# Patient Record
Sex: Female | Born: 1970 | Race: White | Hispanic: No | Marital: Married | Smoking: Never smoker
Health system: Southern US, Community
[De-identification: ages and names within clinical notes are randomized; demographics above are authoritative.]

## PROBLEM LIST (undated history)

## (undated) DIAGNOSIS — E282 Polycystic ovarian syndrome: Secondary | ICD-10-CM

## (undated) DIAGNOSIS — E063 Autoimmune thyroiditis: Secondary | ICD-10-CM

## (undated) HISTORY — DX: Autoimmune thyroiditis: E06.3

## (undated) HISTORY — DX: Polycystic ovarian syndrome: E28.2

---

## 2004-10-13 ENCOUNTER — Ambulatory Visit: Payer: Self-pay | Admitting: Obstetrics and Gynecology

## 2004-11-25 ENCOUNTER — Ambulatory Visit: Payer: Self-pay | Admitting: Obstetrics and Gynecology

## 2004-12-10 ENCOUNTER — Ambulatory Visit: Payer: Self-pay | Admitting: Obstetrics and Gynecology

## 2005-01-24 ENCOUNTER — Ambulatory Visit: Payer: Self-pay | Admitting: Obstetrics and Gynecology

## 2005-03-30 ENCOUNTER — Ambulatory Visit: Payer: Self-pay | Admitting: Obstetrics and Gynecology

## 2005-05-31 ENCOUNTER — Ambulatory Visit: Payer: Self-pay | Admitting: Obstetrics and Gynecology

## 2005-06-08 ENCOUNTER — Ambulatory Visit: Payer: Self-pay | Admitting: Obstetrics and Gynecology

## 2005-07-22 ENCOUNTER — Ambulatory Visit: Payer: Self-pay | Admitting: Obstetrics and Gynecology

## 2005-07-27 ENCOUNTER — Ambulatory Visit: Payer: Self-pay | Admitting: Obstetrics and Gynecology

## 2005-08-24 ENCOUNTER — Ambulatory Visit: Payer: Self-pay | Admitting: Obstetrics and Gynecology

## 2005-08-25 ENCOUNTER — Observation Stay: Payer: Self-pay | Admitting: Obstetrics and Gynecology

## 2005-08-26 ENCOUNTER — Inpatient Hospital Stay: Payer: Self-pay | Admitting: Obstetrics and Gynecology

## 2006-01-24 ENCOUNTER — Ambulatory Visit: Payer: Self-pay | Admitting: Otolaryngology

## 2007-07-13 ENCOUNTER — Ambulatory Visit: Payer: Self-pay | Admitting: Otolaryngology

## 2008-10-15 ENCOUNTER — Ambulatory Visit: Payer: Self-pay | Admitting: Obstetrics and Gynecology

## 2008-10-21 ENCOUNTER — Ambulatory Visit: Payer: Self-pay | Admitting: Obstetrics and Gynecology

## 2008-11-13 ENCOUNTER — Ambulatory Visit: Payer: Self-pay | Admitting: Obstetrics and Gynecology

## 2008-11-21 ENCOUNTER — Ambulatory Visit: Payer: Self-pay | Admitting: Obstetrics and Gynecology

## 2008-12-05 ENCOUNTER — Emergency Department: Payer: Self-pay | Admitting: Emergency Medicine

## 2009-01-09 ENCOUNTER — Other Ambulatory Visit: Payer: Self-pay | Admitting: Obstetrics and Gynecology

## 2009-02-13 ENCOUNTER — Other Ambulatory Visit: Payer: Self-pay | Admitting: Obstetrics and Gynecology

## 2009-02-20 ENCOUNTER — Ambulatory Visit: Payer: Self-pay | Admitting: Obstetrics and Gynecology

## 2009-05-25 ENCOUNTER — Other Ambulatory Visit: Payer: Self-pay | Admitting: Obstetrics and Gynecology

## 2009-07-01 ENCOUNTER — Other Ambulatory Visit: Payer: Self-pay | Admitting: Obstetrics and Gynecology

## 2009-07-02 ENCOUNTER — Encounter: Payer: Self-pay | Admitting: Maternal & Fetal Medicine

## 2009-07-17 ENCOUNTER — Ambulatory Visit: Payer: Self-pay | Admitting: Obstetrics and Gynecology

## 2009-07-18 ENCOUNTER — Inpatient Hospital Stay: Payer: Self-pay | Admitting: Obstetrics and Gynecology

## 2011-05-31 ENCOUNTER — Other Ambulatory Visit: Payer: Self-pay | Admitting: Physician Assistant

## 2011-08-01 ENCOUNTER — Ambulatory Visit: Payer: Self-pay | Admitting: Internal Medicine

## 2011-11-04 ENCOUNTER — Ambulatory Visit: Payer: Self-pay | Admitting: Unknown Physician Specialty

## 2013-09-30 ENCOUNTER — Ambulatory Visit: Payer: Self-pay

## 2014-11-25 ENCOUNTER — Other Ambulatory Visit: Payer: Self-pay | Admitting: Obstetrics and Gynecology

## 2014-11-25 DIAGNOSIS — Z1231 Encounter for screening mammogram for malignant neoplasm of breast: Secondary | ICD-10-CM

## 2014-12-09 ENCOUNTER — Ambulatory Visit
Admission: RE | Admit: 2014-12-09 | Discharge: 2014-12-09 | Disposition: A | Discharge status: Home / Self Care | Payer: 59 | Source: Ambulatory Visit | Attending: Obstetrics and Gynecology | Admitting: Obstetrics and Gynecology

## 2014-12-09 DIAGNOSIS — Z1231 Encounter for screening mammogram for malignant neoplasm of breast: Secondary | ICD-10-CM | POA: Insufficient documentation

## 2015-01-16 ENCOUNTER — Other Ambulatory Visit: Payer: Self-pay | Admitting: Obstetrics and Gynecology

## 2015-03-24 ENCOUNTER — Encounter: Payer: Self-pay | Admitting: Obstetrics and Gynecology

## 2015-03-24 ENCOUNTER — Ambulatory Visit (INDEPENDENT_AMBULATORY_CARE_PROVIDER_SITE_OTHER): Payer: 59 | Admitting: Obstetrics and Gynecology

## 2015-03-24 VITALS — BP 114/72 | HR 55 | Resp 16 | Ht 64.0 in | Wt 154.0 lb

## 2015-03-24 DIAGNOSIS — N926 Irregular menstruation, unspecified: Secondary | ICD-10-CM

## 2015-03-24 DIAGNOSIS — E282 Polycystic ovarian syndrome: Secondary | ICD-10-CM

## 2015-03-24 DIAGNOSIS — Z01419 Encounter for gynecological examination (general) (routine) without abnormal findings: Secondary | ICD-10-CM

## 2015-03-25 ENCOUNTER — Other Ambulatory Visit: Payer: 59

## 2015-03-26 LAB — BASIC METABOLIC PANEL
BUN / CREAT RATIO: 16 (ref 9–23)
BUN: 12 mg/dL (ref 6–24)
CALCIUM: 9.9 mg/dL (ref 8.7–10.2)
CHLORIDE: 96 mmol/L — AB (ref 97–108)
CO2: 24 mmol/L (ref 18–29)
Creatinine, Ser: 0.75 mg/dL (ref 0.57–1.00)
GFR calc Af Amer: 114 mL/min/{1.73_m2} (ref >59)
GFR calc non Af Amer: 99 mL/min/{1.73_m2} (ref >59)
GLUCOSE: 83 mg/dL (ref 65–99)
Potassium: 4.1 mmol/L (ref 3.5–5.2)
Sodium: 138 mmol/L (ref 134–144)

## 2015-03-26 LAB — DHEA-SULFATE: DHEA-SO4: 129.4 ug/dL (ref 57.3–279.2)

## 2015-03-26 LAB — CBC
HEMATOCRIT: 43.8 % (ref 34.0–46.6)
Hemoglobin: 14.8 g/dL (ref 11.1–15.9)
MCH: 32.1 pg (ref 26.6–33.0)
MCHC: 33.8 g/dL (ref 31.5–35.7)
MCV: 95 fL (ref 79–97)
PLATELETS: 224 10*3/uL (ref 150–379)
RBC: 4.61 x10E6/uL (ref 3.77–5.28)
RDW: 13 % (ref 12.3–15.4)
WBC: 5.8 10*3/uL (ref 3.4–10.8)

## 2015-03-26 LAB — LIPID PANEL
CHOLESTEROL TOTAL: 199 mg/dL (ref 100–199)
Chol/HDL Ratio: 3.1 ratio units (ref 0.0–4.4)
HDL: 65 mg/dL (ref >39)
LDL CALC: 117 mg/dL — AB (ref 0–99)
TRIGLYCERIDES: 85 mg/dL (ref 0–149)
VLDL Cholesterol Cal: 17 mg/dL (ref 5–40)

## 2015-03-26 LAB — TESTOSTERONE, FREE, TOTAL, SHBG: TESTOSTERONE FREE: 2.4 pg/mL (ref 0.0–4.2)

## 2015-03-26 LAB — HEMOGLOBIN A1C
ESTIMATED AVERAGE GLUCOSE: 103 mg/dL
HEMOGLOBIN A1C: 5.2 % (ref 4.8–5.6)

## 2015-03-26 LAB — TSH: TSH: 2.05 u[IU]/mL (ref 0.450–4.500)

## 2015-03-27 ENCOUNTER — Encounter: Payer: Self-pay | Admitting: Obstetrics and Gynecology

## 2015-03-27 DIAGNOSIS — N926 Irregular menstruation, unspecified: Secondary | ICD-10-CM | POA: Insufficient documentation

## 2015-03-27 DIAGNOSIS — E282 Polycystic ovarian syndrome: Secondary | ICD-10-CM | POA: Insufficient documentation

## 2015-03-27 NOTE — Progress Notes (Signed)
Subjective:    Stacey Parker is a 44 y.o. P45 female who presents for an annual exam. The patient has no complaints today. The patient is sexually active. GYN screening history: last pap: approximate date 2014 and was normal and last mammogram: approximate date 11/2014 and was normal. The patient wears seatbelts: yes. The patient participates in regular exercise: yes. Has the patient ever been transfused or tattooed?: no. The patient reports that there is not domestic violence in her life.   Menstrual History: Obstetric History   G2   P2   T2   P0   A0   TAB0   SAB0   E0   M0   L2     # Outcome Date GA Lbr Len/2nd Weight Sex Delivery Anes PTL Lv  2 Term 2010   8 lb 12 oz (3.969 kg) F CS-LTranv   Y  1 Term 2007   9 lb 4 oz (4.196 kg) M CS-LTranv   Y     Menarche age: 41  Patient's last menstrual period was 03/12/2015. Denies h/o abnormal pap smears or STI's in the past.    Past Medical History  Diagnosis Date  . Polycystic ovary     Past Surgical History  Procedure Laterality Date  . Cesarean section      x2    Family History  Problem Relation Age of Onset  . Breast cancer Maternal Aunt   . Thyroid disease Mother     Social History   Social History  . Marital Status: Married    Spouse Name: N/A  . Number of Children: N/A  . Years of Education: N/A   Occupational History  . Not on file.   Social History Main Topics  . Smoking status: Never Smoker   . Smokeless tobacco: Not on file  . Alcohol Use: 0.0 oz/week    0 Standard drinks or equivalent per week  . Drug Use: No  . Sexual Activity: Not on file   Other Topics Concern  . Not on file   Social History Narrative    Current Outpatient Prescriptions on File Prior to Visit  Medication Sig Dispense Refill  . LO LOESTRIN FE 1 MG-10 MCG / 10 MCG tablet TAKE 1 TABLET BY MOUTH ONCE DAILY (Patient not taking: Reported on 03/24/2015) 28 tablet 12   No current facility-administered medications on file prior to  visit.    Allergies  Allergen Reactions  . Sulfa Antibiotics Other (See Comments)     Review of Systems Constitutional: negative for chills, fatigue, fevers and sweats Eyes: negative for irritation, redness and visual disturbance Ears, nose, mouth, throat, and face: negative for hearing loss, nasal congestion, snoring and tinnitus Respiratory: negative for asthma, cough, sputum Cardiovascular: negative for chest pain, dyspnea, exertional chest pressure/discomfort, irregular heart beat, palpitations and syncope Gastrointestinal: negative for abdominal pain, change in bowel habits, nausea and vomiting Genitourinary: negative for abnormal menstrual periods, genital lesions, sexual problems and vaginal discharge, dysuria and urinary incontinence Integument/breast: negative for breast lump, breast tenderness and nipple discharge Hematologic/lymphatic: negative for bleeding and easy bruising Musculoskeletal:negative for back pain and muscle weakness Neurological: negative for dizziness, headaches, vertigo and weakness Endocrine: negative for diabetic symptoms including polydipsia, polyuria and skin dryness Allergic/Immunologic: negative for hay fever and urticaria     Objective:    BP 114/72 mmHg  Pulse 55  Resp 16  Ht  (1.626 m)  Wt 154 lb (69.854 kg)  BMI 26.42 kg/m2  LMP 03/12/2015  General Appearance:    Alert, cooperative, no distress, appears stated age  Head:    Normocephalic, without obvious abnormality, atraumatic  Eyes:    PERRL, conjunctiva/corneas clear, EOM's intact, both eyes  Ears:    Normal external ear canals, both ears  Nose:   Nares normal, septum midline, mucosa normal, no drainage or sinus tenderness  Throat:   Lips, mucosa, and tongue normal; teeth and gums normal  Neck:   Supple, symmetrical, trachea midline, no adenopathy; thyroid: no enlargement/tenderness/nodules; no carotid bruit or JVD  Back:     Symmetric, no curvature, ROM normal, no CVA  tenderness  Lungs:     Clear to auscultation bilaterally, respirations unlabored  Chest Wall:    No tenderness or deformity   Heart:    Regular rate and rhythm, S1 and S2 normal, no murmur, rub   or gallop  Breast Exam:    No tenderness, masses, or nipple abnormality  Abdomen:     Soft, non-tender, bowel sounds active all four quadrants, no masses, no organomegaly  Genitalia:    Normal female without lesion, discharge or tenderness  Rectal:    Normal tone, internal exam not performed.     Extremities:   Extremities normal, atraumatic, no cyanosis or edema  Pulses:   2+ and symmetric all extremities  Skin:   Skin color, texture, turgor normal, no rashes or lesions  Lymph nodes:   Cervical, supraclavicular, and axillary nodes normal  Neurologic:   CNII-XII intact, normal strength, sensation and reflexes throughout  .    Assessment:    Healthy female exam.   H/o PCOS with irregular menses  Plan:     Breast self exam technique reviewed and patient encouraged to perform self-exam monthly. Contraception: currently on OCPs, but desires to discontinue for short time as she wants to know if her periods will remain regular off of the hormones (has been on OCPs for years). Patient can take several months off pills if desired.  If menses becomes irregular again, can resume pills or return to discuss alternative methods of contraception for hormonal regulation of menses.  Advised to use barrier method during time off pills for prevention of pregnancy.  Discussed healthy lifestyle modifications. Annual labs and PCOS labs ordered, will return in a.m. to complete so that she can be fasting.  Pap smear due next year.  RTC in 1 year or as needed.    Hildred Laser, MD [facility] 03/27/2015 1:10 PM

## 2015-03-31 LAB — INSULIN, FREE AND TOTAL
Free Insulin: 4.6 uU/mL
Total Insulin: 4.6 uU/mL

## 2015-04-03 LAB — VITAMIN D 1,25 DIHYDROXY
Vitamin D 1, 25 (OH)2 Total: 82 pg/mL
Vitamin D2 1, 25 (OH)2: <10 pg/mL
Vitamin D3 1, 25 (OH)2: 82 pg/mL

## 2015-05-16 ENCOUNTER — Ambulatory Visit
Admission: EM | Admit: 2015-05-16 | Discharge: 2015-05-16 | Disposition: A | Discharge status: Home / Self Care | Payer: 59 | Attending: Family Medicine | Admitting: Family Medicine

## 2015-05-16 DIAGNOSIS — J029 Acute pharyngitis, unspecified: Secondary | ICD-10-CM

## 2015-05-16 LAB — RAPID STREP SCREEN (MED CTR MEBANE ONLY): STREPTOCOCCUS, GROUP A SCREEN (DIRECT): NEGATIVE

## 2015-05-16 NOTE — ED Notes (Signed)
Patient complains of sore throat that started yesterday. Patient states that she went to play tennis today and her pain worsened. She states that she is concerned that it could be Strep.

## 2015-05-16 NOTE — ED Provider Notes (Signed)
CSN: [id number]     Arrival date & time 05/16/15  1559 History   None    Chief Complaint  Patient presents with  . Sore Throat   (Consider location/radiation/quality/duration/timing/severity/associated sxs/prior Treatment) HPI Comments: 44 yo female with a 2 days h/o sore throat. States sore throat started yesterday. Discomfort with swallowing, however denies fevers, chills, nasal congestion, cough. Has small children at home and would like to get checked for strep throat.   Patient is a 44 y.o. female presenting with pharyngitis. The history is provided by the patient.  Sore Throat    Past Medical History  Diagnosis Date  . Polycystic ovary    Past Surgical History  Procedure Laterality Date  . Cesarean section      x2   Family History  Problem Relation Age of Onset  . Breast cancer Maternal Aunt   . Thyroid disease Mother    Social History  Substance Use Topics  . Smoking status: Never Smoker   . Smokeless tobacco: None  . Alcohol Use: 0.0 oz/week    0 Standard drinks or equivalent per week   OB History    Gravida Para Term Preterm AB TAB SAB Ectopic Multiple Living   Review of Systems  Allergies  Sulfa antibiotics  Home Medications   Prior to Admission medications   Medication Sig Start Date End Date Taking? Authorizing Provider  LO LOESTRIN FE 1 MG-10 MCG / 10 MCG tablet TAKE 1 TABLET BY MOUTH ONCE DAILY Patient not taking: Reported on 03/24/2015 01/20/15   Hildred Laser, MD   Meds Ordered and Administered this Visit  Medications - No data to display  BP 95/68 mmHg  Pulse 68  Temp(Src) 98 F (36.7 C) (Tympanic)  Resp 16  Ht 5' 4.5" (1.638 m)  SpO2 98%  LMP 05/09/2015 No data found.   Physical Exam  Constitutional: She appears well-developed and well-nourished. No distress.  HENT:  Head: Normocephalic.  Right Ear: Tympanic membrane, external ear and ear canal normal.  Left Ear: Tympanic membrane, external ear and ear canal  normal.  Nose: Nose normal.  Mouth/Throat: Uvula is midline and mucous membranes are normal. Posterior oropharyngeal erythema present. No oropharyngeal exudate, posterior oropharyngeal edema or tonsillar abscesses.  Eyes: Conjunctivae and EOM are normal. Pupils are equal, round, and reactive to light. Right eye exhibits no discharge. Left eye exhibits no discharge. No scleral icterus.  Neck: Normal range of motion. Neck supple. No JVD present. No tracheal deviation present. No thyromegaly present.  Cardiovascular: Normal rate.   Pulmonary/Chest: Effort normal and breath sounds normal. No stridor. No respiratory distress. She has no wheezes. She has no rales.  Abdominal: She exhibits no distension.  Lymphadenopathy:    She has no cervical adenopathy.  Neurological: She is alert.  Skin: She is not diaphoretic.  Nursing note and vitals reviewed.   ED Course  Procedures (including critical care time)  Labs Review Labs Reviewed  RAPID STREP SCREEN (NOT AT [facility])  CULTURE, GROUP A STREP ([facility] ONLY)    Imaging Review No results found.   Visual Acuity Review  Right Eye Distance:   Left Eye Distance:   Bilateral Distance:    Right Eye Near:   Left Eye Near:    Bilateral Near:         MDM   1. Pharyngitis    (likely viral)  1. Lab results (negative rapid strep) and diagnosis reviewed  with patient 2. Recommend supportive treatment with salt water gargles, otc analgesics prn 3. Await throat culture 4. Follow up prn if symptoms worsen or don't improve    Payton Mccallumrlando Marvin Grabill, MD 05/16/15 1725

## 2015-05-18 LAB — CULTURE, GROUP A STREP (THRC)

## 2015-07-01 ENCOUNTER — Ambulatory Visit: Payer: 59 | Admitting: Obstetrics and Gynecology

## 2015-09-21 DIAGNOSIS — E349 Endocrine disorder, unspecified: Secondary | ICD-10-CM | POA: Diagnosis not present

## 2015-09-28 ENCOUNTER — Ambulatory Visit: Payer: Self-pay | Admitting: Family

## 2015-09-28 DIAGNOSIS — J029 Acute pharyngitis, unspecified: Secondary | ICD-10-CM

## 2015-09-28 DIAGNOSIS — B349 Viral infection, unspecified: Secondary | ICD-10-CM

## 2015-09-28 LAB — POCT INFLUENZA A/B: Influenza A, POC: NEGATIVE

## 2015-09-28 LAB — POCT RAPID STREP A (OFFICE): Rapid Strep A Screen: NEGATIVE

## 2015-09-29 ENCOUNTER — Encounter: Payer: Self-pay | Admitting: Family

## 2015-09-29 NOTE — Progress Notes (Signed)
See note in paper chart by Heather Ratcliffe, PAC  

## 2015-09-30 ENCOUNTER — Encounter: Payer: Self-pay | Admitting: Physician Assistant

## 2015-09-30 ENCOUNTER — Ambulatory Visit: Payer: Self-pay | Admitting: Family

## 2015-09-30 VITALS — BP 100/70 | Temp 98.4°F

## 2015-09-30 DIAGNOSIS — B9789 Other viral agents as the cause of diseases classified elsewhere: Secondary | ICD-10-CM

## 2015-09-30 DIAGNOSIS — J329 Chronic sinusitis, unspecified: Principal | ICD-10-CM

## 2015-09-30 DIAGNOSIS — E349 Endocrine disorder, unspecified: Secondary | ICD-10-CM | POA: Diagnosis not present

## 2015-10-01 NOTE — Progress Notes (Signed)
See note in paper chart by Heather Ratcliffe, PAC  

## 2016-01-25 DIAGNOSIS — E2839 Other primary ovarian failure: Secondary | ICD-10-CM | POA: Diagnosis not present

## 2016-01-25 DIAGNOSIS — E063 Autoimmune thyroiditis: Secondary | ICD-10-CM | POA: Diagnosis not present

## 2016-03-24 ENCOUNTER — Encounter: Payer: 59 | Admitting: Obstetrics and Gynecology

## 2016-05-10 ENCOUNTER — Encounter: Payer: Self-pay | Admitting: Obstetrics and Gynecology

## 2016-05-10 ENCOUNTER — Ambulatory Visit (INDEPENDENT_AMBULATORY_CARE_PROVIDER_SITE_OTHER): Payer: 59 | Admitting: Obstetrics and Gynecology

## 2016-05-10 VITALS — BP 112/62 | HR 75 | Ht 64.0 in | Wt 154.4 lb

## 2016-05-10 DIAGNOSIS — E063 Autoimmune thyroiditis: Secondary | ICD-10-CM | POA: Diagnosis not present

## 2016-05-10 DIAGNOSIS — Z01419 Encounter for gynecological examination (general) (routine) without abnormal findings: Secondary | ICD-10-CM

## 2016-05-10 DIAGNOSIS — Z124 Encounter for screening for malignant neoplasm of cervix: Secondary | ICD-10-CM | POA: Diagnosis not present

## 2016-05-10 DIAGNOSIS — Z1231 Encounter for screening mammogram for malignant neoplasm of breast: Secondary | ICD-10-CM

## 2016-05-10 NOTE — Progress Notes (Signed)
GYNECOLOGY ANNUAL PHYSICAL EXAM PROGRESS NOTE  Subjective:    Stacey Parker is a 45 y.o. [id number] female who presents for an annual exam. The patient has no complaints today. The patient is sexually active.  The patient wears seatbelts: yes. The patient participates in regular exercise: yes. Has the patient ever been transfused or tattooed?: no. The patient reports that there is not domestic violence in her life.    Gynecologic History  Menarche age: [age] Patient's last menstrual period was 04/20/2016.  Periods were irregular, q 2 weeks several months ago, now on progesterone tablets and cycles are normal.  Contraception: condoms History of STI's: Denies Last Pap: 2014. Results were: normal.  Denies h/o abnormal pap smears. Last mammogram: 11/2014. Results were: normal (BIRADS-1)   Obstetric History   G2   P2   T2   P0   A0   L2    SAB0   TAB0   Ectopic0   Multiple0   Live Births2     # Outcome Date GA Lbr Len/2nd Weight Sex Delivery Anes PTL Lv  2 Term 2010   8 lb 12 oz (3.969 kg) F CS-LTranv   LIV  1 Term 2007   9 lb 4 oz (4.196 kg) M CS-LTranv   LIV      Past Medical History:  Diagnosis Date  . Hashimoto's thyroiditis   . Polycystic ovary     Past Surgical History:  Procedure Laterality Date  . CESAREAN SECTION     x2    Family History  Problem Relation Age of Onset  . Breast cancer Maternal Aunt   . Thyroid disease Mother   . Heart attack Mother   . Prostate cancer Father   . Heart attack Maternal Grandmother   . Prostate cancer Paternal Grandfather     Social History   Social History  . Marital status: Married    Spouse name: N/A  . Number of children: N/A  . Years of education: N/A   Occupational History  . Not on file.   Social History Main Topics  . Smoking status: Never Smoker  . Smokeless tobacco: Never Used  . Alcohol use No  . Drug use: No  . Sexual activity: Yes    Birth control/ protection: Condom   Other Topics Concern  . Not  on file   Social History Narrative  . No narrative on file    Outpatient Encounter Prescriptions as of 05/10/2016  Medication Sig Note  . Progesterone Micronized (PROGESTERONE PO) Take by mouth. Take on days 12-26 through of cycle   . SYNTHROID 50 MCG tablet  05/10/2016: Received from: External Pharmacy  . [DISCONTINUED] LO LOESTRIN FE 1 MG-10 MCG / 10 MCG tablet TAKE 1 TABLET BY MOUTH ONCE DAILY (Patient not taking: Reported on 03/24/2015)    No facility-administered encounter medications on file as of 05/10/2016.      Allergies  Allergen Reactions  . Sulfa Antibiotics Other (See Comments)    Review of Systems Constitutional: negative for chills, fatigue, fevers and sweats Eyes: negative for irritation, redness and visual disturbance Ears, nose, mouth, throat, and face: negative for hearing loss, nasal congestion, snoring and tinnitus Respiratory: negative for asthma, cough, sputum Cardiovascular: negative for chest pain, dyspnea, exertional chest pressure/discomfort, irregular heart beat, palpitations and syncope Gastrointestinal: negative for abdominal pain, change in bowel habits, nausea and vomiting Genitourinary: negative for abnormal menstrual periods, genital lesions, sexual problems and vaginal discharge, dysuria and urinary incontinence Integument/breast: negative for  breast lump, breast tenderness and nipple discharge Hematologic/lymphatic: negative for bleeding and easy bruising Musculoskeletal:negative for back pain and muscle weakness Neurological: negative for dizziness, headaches, vertigo and weakness Endocrine: negative for diabetic symptoms including polydipsia, polyuria and skin dryness Allergic/Immunologic: negative for hay fever and urticaria       Objective:  Blood pressure 112/62, pulse 75, height 5\' 4"  (1.626 m), weight 154 lb 6.4 oz (70 kg), last menstrual period 04/20/2016. Body mass index is 26.5 kg/m.  General Appearance:    Alert, cooperative, no  distress, appears stated age  Head:    Normocephalic, without obvious abnormality, atraumatic  Eyes:    PERRL, conjunctiva/corneas clear, EOM's intact, both eyes  Ears:    Normal external ear canals, both ears  Nose:   Nares normal, septum midline, mucosa normal, no drainage or sinus tenderness  Throat:   Lips, mucosa, and tongue normal; teeth and gums normal  Neck:   Supple, symmetrical, trachea midline, no adenopathy; thyroid: no enlargement/tenderness/nodules; no carotid bruit or JVD  Back:     Symmetric, no curvature, ROM normal, no CVA tenderness  Lungs:     Clear to auscultation bilaterally, respirations unlabored  Chest Wall:    No tenderness or deformity   Heart:    Regular rate and rhythm, S1 and S2 normal, no murmur, rub or gallop  Breast Exam:    No tenderness, masses, or nipple abnormality  Abdomen:     Soft, non-tender, bowel sounds active all four quadrants, no masses, no organomegaly.    Genitalia:    Pelvic:external genitalia normal, vagina without lesions, discharge, or tenderness, rectovaginal septum  normal. Cervix normal in appearance, no cervical motion tenderness, no adnexal masses or tenderness.  Uterus normal size, shape, mobile, regular contours, nontender.  Rectal:    Normal external sphincter.  No hemorrhoids appreciated. Internal exam not done.   Extremities:   Extremities normal, atraumatic, no cyanosis or edema  Pulses:   2+ and symmetric all extremities  Skin:   Skin color, texture, turgor normal, no rashes or lesions  Lymph nodes:   Cervical, supraclavicular, and axillary nodes normal  Neurologic:   CNII-XII intact, normal strength, sensation and reflexes throughout    Labs:  Lab Results  Component Value Date   WBC 5.8 03/25/2015   HCT 43.8 03/25/2015   MCV 95 03/25/2015   PLT 224 03/25/2015    Lab Results  Component Value Date   CREATININE 0.75 03/25/2015   BUN 12 03/25/2015   NA 138 03/25/2015   K 4.1 03/25/2015   CL 96 (L) 03/25/2015   CO2 24  03/25/2015    No results found for: ALT, AST, GGT, ALKPHOS, BILITOT  Lab Results  Component Value Date   TSH 2.050 03/25/2015     Assessment:   Healthy female exam.  H/o Hashiomoto's thyroiditis Perimenopausal state  Plan:     Blood tests: patient had labs performed recently by PCP 3 months ago.  Will get labs (performed at [facility]). Breast self exam technique reviewed and patient encouraged to perform self-exam monthly. Discussed healthy lifestyle modifications. Mammogram ordered. Pap smear performed today.   Declines flu vaccine today.  Hashimoto's thyroiditis managed by PCP.  Perimenopausal state, currently on progesterone tablets prescribed by PCP, managing cycles which are now back to normal. Can continue.  Follow up in 1 year for annual exam     Hildred Laser, MD [facility]

## 2016-05-10 NOTE — Patient Instructions (Signed)
Health Maintenance, Female Adopting a healthy lifestyle and getting preventive care can go a long way to promote health and wellness. Talk with your health care provider about what schedule of regular examinations is right for you. This is a good chance for you to check in with your provider about disease prevention and staying healthy. In between checkups, there are plenty of things you can do on your own. Experts have done a lot of research about which lifestyle changes and preventive measures are most likely to keep you healthy. Ask your health care provider for more information. WEIGHT AND DIET  Eat a healthy diet  Be sure to include plenty of vegetables, fruits, low-fat dairy products, and lean protein.  Do not eat a lot of foods high in solid fats, added sugars, or salt.  Get regular exercise. This is one of the most important things you can do for your health.  Most adults should exercise for at least 150 minutes each week. The exercise should increase your heart rate and make you sweat (moderate-intensity exercise).  Most adults should also do strengthening exercises at least twice a week. This is in addition to the moderate-intensity exercise.  Maintain a healthy weight  Body mass index (BMI) is a measurement that can be used to identify possible weight problems. It estimates body fat based on height and weight. Your health care provider can help determine your BMI and help you achieve or maintain a healthy weight.  For females 20 years of age and older:   A BMI below 18.5 is considered underweight.  A BMI of 18.5 to 24.9 is normal.  A BMI of 25 to 29.9 is considered overweight.  A BMI of 30 and above is considered obese.  Watch levels of cholesterol and blood lipids  You should start having your blood tested for lipids and cholesterol at 43 years of age, then have this test every 5 years.  You may need to have your cholesterol levels checked more often if:  Your lipid  or cholesterol levels are high.  You are older than 43 years of age.  You are at high risk for heart disease.  CANCER SCREENING   Lung Cancer  Lung cancer screening is recommended for adults 55-80 years old who are at high risk for lung cancer because of a history of smoking.  A yearly low-dose CT scan of the lungs is recommended for people who:  Currently smoke.  Have quit within the past 15 years.  Have at least a 30-pack-year history of smoking. A pack year is smoking an average of one pack of cigarettes a day for 1 year.  Yearly screening should continue until it has been 15 years since you quit.  Yearly screening should stop if you develop a health problem that would prevent you from having lung cancer treatment.  Breast Cancer  Practice breast self-awareness. This means understanding how your breasts normally appear and feel.  It also means doing regular breast self-exams. Let your health care provider know about any changes, no matter how small.  If you are in your 20s or 30s, you should have a clinical breast exam (CBE) by a health care provider every 1-3 years as part of a regular health exam.  If you are 40 or older, have a CBE every year. Also consider having a breast X-ray (mammogram) every year.  If you have a family history of breast cancer, talk to your health care provider about genetic screening.  If you   are at high risk for breast cancer, talk to your health care provider about having an MRI and a mammogram every year.  Breast cancer gene (BRCA) assessment is recommended for women who have family members with BRCA-related cancers. BRCA-related cancers include:  Breast.  Ovarian.  Tubal.  Peritoneal cancers.  Results of the assessment will determine the need for genetic counseling and BRCA1 and BRCA2 testing. Cervical Cancer Your health care provider may recommend that you be screened regularly for cancer of the pelvic organs (ovaries, uterus, and  vagina). This screening involves a pelvic examination, including checking for microscopic changes to the surface of your cervix (Pap test). You may be encouraged to have this screening done every 3 years, beginning at age 21.  For women ages 30-65, health care providers may recommend pelvic exams and Pap testing every 3 years, or they may recommend the Pap and pelvic exam, combined with testing for human papilloma virus (HPV), every 5 years. Some types of HPV increase your risk of cervical cancer. Testing for HPV may also be done on women of any age with unclear Pap test results.  Other health care providers may not recommend any screening for nonpregnant women who are considered low risk for pelvic cancer and who do not have symptoms. Ask your health care provider if a screening pelvic exam is right for you.  If you have had past treatment for cervical cancer or a condition that could lead to cancer, you need Pap tests and screening for cancer for at least 20 years after your treatment. If Pap tests have been discontinued, your risk factors (such as having a new sexual partner) need to be reassessed to determine if screening should resume. Some women have medical problems that increase the chance of getting cervical cancer. In these cases, your health care provider may recommend more frequent screening and Pap tests. Colorectal Cancer  This type of cancer can be detected and often prevented.  Routine colorectal cancer screening usually begins at 43 years of age and continues through 43 years of age.  Your health care provider may recommend screening at an earlier age if you have risk factors for colon cancer.  Your health care provider may also recommend using home test kits to check for hidden blood in the stool.  A small camera at the end of a tube can be used to examine your colon directly (sigmoidoscopy or colonoscopy). This is done to check for the earliest forms of colorectal  cancer.  Routine screening usually begins at age 50.  Direct examination of the colon should be repeated every 5-10 years through 43 years of age. However, you may need to be screened more often if early forms of precancerous polyps or small growths are found. Skin Cancer  Check your skin from head to toe regularly.  Tell your health care provider about any new moles or changes in moles, especially if there is a change in a mole's shape or color.  Also tell your health care provider if you have a mole that is larger than the size of a pencil eraser.  Always use sunscreen. Apply sunscreen liberally and repeatedly throughout the day.  Protect yourself by wearing long sleeves, pants, a wide-brimmed hat, and sunglasses whenever you are outside. HEART DISEASE, DIABETES, AND HIGH BLOOD PRESSURE   High blood pressure causes heart disease and increases the risk of stroke. High blood pressure is more likely to develop in:  People who have blood pressure in the high end   of the normal range (130-139/85-89 mm Hg).  People who are overweight or obese.  People who are African American.  If you are 38-23 years of age, have your blood pressure checked every 3-5 years. If you are 61 years of age or older, have your blood pressure checked every year. You should have your blood pressure measured twice--once when you are at a hospital or clinic, and once when you are not at a hospital or clinic. Record the average of the two measurements. To check your blood pressure when you are not at a hospital or clinic, you can use:  An automated blood pressure machine at a pharmacy.  A home blood pressure monitor.  If you are between 45 years and 39 years old, ask your health care provider if you should take aspirin to prevent strokes.  Have regular diabetes screenings. This involves taking a blood sample to check your fasting blood sugar level.  If you are at a normal weight and have a low risk for diabetes,  have this test once every three years after 43 years of age.  If you are overweight and have a high risk for diabetes, consider being tested at a younger age or more often. PREVENTING INFECTION  Hepatitis B  If you have a higher risk for hepatitis B, you should be screened for this virus. You are considered at high risk for hepatitis B if:  You were born in a country where hepatitis B is common. Ask your health care provider which countries are considered high risk.  Your parents were born in a high-risk country, and you have not been immunized against hepatitis B (hepatitis B vaccine).  You have HIV or AIDS.  You use needles to inject street drugs.  You live with someone who has hepatitis B.  You have had sex with someone who has hepatitis B.  You get hemodialysis treatment.  You take certain medicines for conditions, including cancer, organ transplantation, and autoimmune conditions. Hepatitis C  Blood testing is recommended for:  Everyone born from [birth year] through 1963.  Anyone with known risk factors for hepatitis C. Sexually transmitted infections (STIs)  You should be screened for sexually transmitted infections (STIs) including gonorrhea and chlamydia if:  You are sexually active and are younger than 43 years of age.  You are older than 43 years of age and your health care provider tells you that you are at risk for this type of infection.  Your sexual activity has changed since you were last screened and you are at an increased risk for chlamydia or gonorrhea. Ask your health care provider if you are at risk.  If you do not have HIV, but are at risk, it may be recommended that you take a prescription medicine daily to prevent HIV infection. This is called pre-exposure prophylaxis (PrEP). You are considered at risk if:  You are sexually active and do not regularly use condoms or know the HIV status of your partner(s).  You take drugs by injection.  You are sexually  active with a partner who has HIV. Talk with your health care provider about whether you are at high risk of being infected with HIV. If you choose to begin PrEP, you should first be tested for HIV. You should then be tested every 3 months for as long as you are taking PrEP.  PREGNANCY   If you are premenopausal and you may become pregnant, ask your health care provider about preconception counseling.  If you may  become pregnant, take 400 to 800 micrograms (mcg) of folic acid every day.  If you want to prevent pregnancy, talk to your health care provider about birth control (contraception). OSTEOPOROSIS AND MENOPAUSE   Osteoporosis is a disease in which the bones lose minerals and strength with aging. This can result in serious bone fractures. Your risk for osteoporosis can be identified using a bone density scan.  If you are 61 years of age or older, or if you are at risk for osteoporosis and fractures, ask your health care provider if you should be screened.  Ask your health care provider whether you should take a calcium or vitamin D supplement to lower your risk for osteoporosis.  Menopause may have certain physical symptoms and risks.  Hormone replacement therapy may reduce some of these symptoms and risks. Talk to your health care provider about whether hormone replacement therapy is right for you.  HOME CARE INSTRUCTIONS   Schedule regular health, dental, and eye exams.  Stay current with your immunizations.   Do not use any tobacco products including cigarettes, chewing tobacco, or electronic cigarettes.  If you are pregnant, do not drink alcohol.  If you are breastfeeding, limit how much and how often you drink alcohol.  Limit alcohol intake to no more than 1 drink per day for nonpregnant women. One drink equals 12 ounces of beer, 5 ounces of wine, or 1 ounces of hard liquor.  Do not use street drugs.  Do not share needles.  Ask your health care provider for help if  you need support or information about quitting drugs.  Tell your health care provider if you often feel depressed.  Tell your health care provider if you have ever been abused or do not feel safe at home.   This information is not intended to replace advice given to you by your health care provider. Make sure you discuss any questions you have with your health care provider.   Document Released: 01/31/2011 Document Revised: 08/08/2014 Document Reviewed: 06/19/2013 Elsevier Interactive Patient Education [employer].

## 2016-05-13 LAB — PAP IG AND HPV HIGH-RISK
HPV, high-risk: NEGATIVE
PAP Smear Comment: 0

## 2016-05-16 ENCOUNTER — Telehealth: Payer: Self-pay

## 2016-05-16 NOTE — Telephone Encounter (Signed)
LM for pt informing her of normal pap and the need to have annual labs done

## 2016-05-16 NOTE — Telephone Encounter (Signed)
-----   Message from Hildred LaserAnika Cherry, MD sent at 05/16/2016 10:55 AM EDT ----- Please inform of normal pap smear.  Remind patient to have fasting annual labs done.

## 2016-06-07 ENCOUNTER — Ambulatory Visit
Admission: RE | Admit: 2016-06-07 | Discharge: 2016-06-07 | Disposition: A | Discharge status: Home / Self Care | Payer: 59 | Source: Ambulatory Visit | Attending: Obstetrics and Gynecology | Admitting: Obstetrics and Gynecology

## 2016-06-07 DIAGNOSIS — Z01419 Encounter for gynecological examination (general) (routine) without abnormal findings: Secondary | ICD-10-CM

## 2016-06-07 DIAGNOSIS — Z1231 Encounter for screening mammogram for malignant neoplasm of breast: Secondary | ICD-10-CM | POA: Insufficient documentation

## 2016-06-14 DIAGNOSIS — E2839 Other primary ovarian failure: Secondary | ICD-10-CM | POA: Diagnosis not present

## 2016-06-14 DIAGNOSIS — E063 Autoimmune thyroiditis: Secondary | ICD-10-CM | POA: Diagnosis not present

## 2016-07-01 ENCOUNTER — Encounter: Payer: Self-pay | Admitting: Physician Assistant

## 2016-07-01 ENCOUNTER — Ambulatory Visit: Payer: Self-pay | Admitting: Physician Assistant

## 2016-07-01 VITALS — BP 110/70 | HR 72 | Temp 98.3°F

## 2016-07-01 DIAGNOSIS — J0100 Acute maxillary sinusitis, unspecified: Secondary | ICD-10-CM

## 2016-07-01 MED ORDER — AMOXICILLIN 875 MG PO TABS: 875.0000 mg | ORAL_TABLET | Freq: Two times a day (BID) | ORAL | 0 refills | Status: DC

## 2016-07-01 NOTE — Progress Notes (Signed)
S: C/o runny nose and congestion for 7 days, no fever, chills, cp/sob, v/d; mucus is green and thick, cough is sporadic and dry, c/o of facial and dental pain.   Using otc meds:   O: PE: vitals wnl, nad, perrl eomi, normocephalic, tms dull, nasal mucosa red and swollen, throat injected, neck supple no lymph, lungs c t a, cv rrr, neuro intact  A:  Acute sinusitis   P: drink fluids, continue regular meds , use otc meds of choice, return if not improving in 5 days, return earlier if worsening, amoxil 875mg  bid x 10d

## 2016-10-19 DIAGNOSIS — E2839 Other primary ovarian failure: Secondary | ICD-10-CM | POA: Diagnosis not present

## 2016-10-19 DIAGNOSIS — E063 Autoimmune thyroiditis: Secondary | ICD-10-CM | POA: Diagnosis not present

## 2017-01-02 DIAGNOSIS — E063 Autoimmune thyroiditis: Secondary | ICD-10-CM | POA: Diagnosis not present

## 2017-01-02 DIAGNOSIS — E2839 Other primary ovarian failure: Secondary | ICD-10-CM | POA: Diagnosis not present

## 2017-02-23 DIAGNOSIS — E2839 Other primary ovarian failure: Secondary | ICD-10-CM | POA: Diagnosis not present

## 2017-02-23 DIAGNOSIS — E063 Autoimmune thyroiditis: Secondary | ICD-10-CM | POA: Diagnosis not present

## 2017-05-15 ENCOUNTER — Telehealth: Payer: Self-pay | Admitting: Obstetrics and Gynecology

## 2017-05-15 DIAGNOSIS — Z1239 Encounter for other screening for malignant neoplasm of breast: Secondary | ICD-10-CM

## 2017-05-15 NOTE — Telephone Encounter (Signed)
Attempted to reach pt to make her aware that physician was not currently in office till tomorrow, mailbox was full   Dr. Valentino Saxon, please advise if ok to place order for mammogram.  Pt's PE is scheduled 08/02/17

## 2017-05-15 NOTE — Telephone Encounter (Signed)
Yes, it is fine for her to have her mammogram prior to her annual exam since it was moved.

## 2017-05-15 NOTE — Telephone Encounter (Signed)
Patient called and stated that she would like to know if she is able to have her mammogram done prior to having her annual exam since the patient had to reschedule the appointment out further due to a meeting. No other information was disclosed. Please advise.

## 2017-05-16 ENCOUNTER — Encounter: Payer: 59 | Admitting: Obstetrics and Gynecology

## 2017-05-17 NOTE — Telephone Encounter (Signed)
Left message on machine to call back to inform pt that this was ok. Dr. Valentino Saxonherry has signed the order

## 2017-06-05 NOTE — Telephone Encounter (Signed)
Looks like pt is still not scheduled for her mammogram. Will follow up with pt to assure nothing further is needed

## 2017-08-02 ENCOUNTER — Encounter: Payer: Self-pay | Admitting: Obstetrics and Gynecology

## 2017-08-02 ENCOUNTER — Ambulatory Visit (INDEPENDENT_AMBULATORY_CARE_PROVIDER_SITE_OTHER): Payer: 59 | Admitting: Obstetrics and Gynecology

## 2017-08-02 VITALS — BP 106/68 | HR 67 | Ht 64.5 in | Wt 157.5 lb

## 2017-08-02 DIAGNOSIS — E063 Autoimmune thyroiditis: Secondary | ICD-10-CM

## 2017-08-02 DIAGNOSIS — E663 Overweight: Secondary | ICD-10-CM | POA: Diagnosis not present

## 2017-08-02 DIAGNOSIS — Z1231 Encounter for screening mammogram for malignant neoplasm of breast: Secondary | ICD-10-CM

## 2017-08-02 DIAGNOSIS — Z01419 Encounter for gynecological examination (general) (routine) without abnormal findings: Secondary | ICD-10-CM | POA: Diagnosis not present

## 2017-08-02 NOTE — Progress Notes (Signed)
GYNECOLOGY ANNUAL PHYSICAL EXAM PROGRESS NOTE  Subjective:    Stacey Parker is a 47 y.o. G33P2002 female who presents for an annual exam. The patient has no complaints today. The patient is sexually active.  The patient wears seatbelts: yes. The patient participates in regular exercise: yes. Has the patient ever been transfused or tattooed?: no. The patient reports that there is not domestic violence in her life.    Gynecologic History  Menarche age: 33 Patient's last menstrual period was 07/26/2017 (exact date).   Contraception: condoms History of STI's: Denies Last Pap: 05/2016. Results were: normal.  Denies h/o abnormal pap smears. Last mammogram: 06/2016. Results were: normal (BIRADS-1)   Obstetric History   G2   P2   T2   P0   A0   L2    SAB0   TAB0   Ectopic0   Multiple0   Live Births2     # Outcome Date GA Lbr Len/2nd Weight Sex Delivery Anes PTL Lv  2 Term 2010   8 lb 12 oz (3.969 kg) F CS-LTranv   LIV  1 Term 2007   9 lb 4 oz (4.196 kg) M CS-LTranv   LIV      Past Medical History:  Diagnosis Date  . Hashimoto's thyroiditis   . Polycystic ovary     Past Surgical History:  Procedure Laterality Date  . CESAREAN SECTION     x2    Family History  Problem Relation Age of Onset  . Breast cancer Maternal Aunt 58  . Thyroid disease Mother   . Heart attack Mother   . Prostate cancer Father   . Heart attack Maternal Grandmother   . Prostate cancer Paternal Grandfather     Social History   Socioeconomic History  . Marital status: Married    Spouse name: Not on file  . Number of children: Not on file  . Years of education: Not on file  . Highest education level: Not on file  Social Needs  . Financial resource strain: Not on file  . Food insecurity - worry: Not on file  . Food insecurity - inability: Not on file  . Transportation needs - medical: Not on file  . Transportation needs - non-medical: Not on file  Occupational History  . Not on file    Tobacco Use  . Smoking status: Never Smoker  . Smokeless tobacco: Never Used  Substance and Sexual Activity  . Alcohol use: No    Alcohol/week: 0.0 oz  . Drug use: No  . Sexual activity: Yes    Birth control/protection: Condom  Other Topics Concern  . Not on file  Social History Narrative  . Not on file    Outpatient Encounter Medications as of 08/02/2017  Medication Sig Note  . Progesterone Micronized (PROGESTERONE PO) Take by mouth. Take on days 12-26 through of cycle   . SYNTHROID 50 MCG tablet  05/10/2016: Received from: External Pharmacy  . [DISCONTINUED] amoxicillin (AMOXIL) 875 MG tablet Take 1 tablet (875 mg total) by mouth 2 (two) times daily.    No facility-administered encounter medications on file as of 08/02/2017.      Allergies  Allergen Reactions  . Sulfa Antibiotics Other (See Comments)    Review of Systems Constitutional: negative for chills, fatigue, fevers and sweats Eyes: negative for irritation, redness and visual disturbance Ears, nose, mouth, throat, and face: negative for hearing loss, nasal congestion, snoring and tinnitus Respiratory: negative for asthma, cough, sputum Cardiovascular: negative for  chest pain, dyspnea, exertional chest pressure/discomfort, irregular heart beat, palpitations and syncope Gastrointestinal: negative for abdominal pain, change in bowel habits, nausea and vomiting Genitourinary: negative for abnormal menstrual periods, genital lesions, sexual problems and vaginal discharge, dysuria and urinary incontinence Integument/breast: negative for breast lump, breast tenderness and nipple discharge Hematologic/lymphatic: negative for bleeding and easy bruising Musculoskeletal:negative for back pain and muscle weakness Neurological: negative for dizziness, headaches, vertigo and weakness Endocrine: negative for diabetic symptoms including polydipsia, polyuria and skin dryness Allergic/Immunologic: negative for hay fever and urticaria        Objective:  Blood pressure 106/68, pulse 67, height 5' 4.5" (1.638 m), weight 157 lb 8 oz (71.4 kg), last menstrual period 07/26/2017. Body mass index is 26.62 kg/m.  General Appearance:    Alert, cooperative, no distress, appears stated age, overweight  Head:    Normocephalic, without obvious abnormality, atraumatic  Eyes:    PERRL, conjunctiva/corneas clear, EOM's intact, both eyes  Ears:    Normal external ear canals, both ears  Nose:   Nares normal, septum midline, mucosa normal, no drainage or sinus tenderness  Throat:   Lips, mucosa, and tongue normal; teeth and gums normal  Neck:   Supple, symmetrical, trachea midline, no adenopathy; thyroid: no enlargement/tenderness/nodules; no carotid bruit or JVD  Back:     Symmetric, no curvature, ROM normal, no CVA tenderness  Lungs:     Clear to auscultation bilaterally, respirations unlabored  Chest Wall:    No tenderness or deformity   Heart:    Regular rate and rhythm, S1 and S2 normal, no murmur, rub or gallop  Breast Exam:    No tenderness, masses, or nipple abnormality  Abdomen:     Soft, non-tender, bowel sounds active all four quadrants, no masses, no organomegaly.    Genitalia:    Pelvic:external genitalia normal, vagina without lesions, discharge, or tenderness, rectovaginal septum  normal. Cervix normal in appearance, no cervical motion tenderness, no adnexal masses or tenderness.  Uterus normal size, shape, mobile, regular contours, nontender.  Rectal:    Normal external sphincter.  No hemorrhoids appreciated. Internal exam not done.   Extremities:   Extremities normal, atraumatic, no cyanosis or edema  Pulses:   2+ and symmetric all extremities  Skin:   Skin color, texture, turgor normal, no rashes or lesions  Lymph nodes:   Cervical, supraclavicular, and axillary nodes normal  Neurologic:   CNII-XII intact, normal strength, sensation and reflexes throughout    Labs:  Lab Results  Component Value Date   WBC 5.8  03/25/2015   HGB 14.8 03/25/2015   HCT 43.8 03/25/2015   MCV 95 03/25/2015   PLT 224 03/25/2015    Lab Results  Component Value Date   CREATININE 0.75 03/25/2015   BUN 12 03/25/2015   NA 138 03/25/2015   K 4.1 03/25/2015   CL 96 (L) 03/25/2015   CO2 24 03/25/2015    No results found for: ALT, AST, GGT, ALKPHOS, BILITOT  Lab Results  Component Value Date   TSH 2.050 03/25/2015    Lab Results  Component Value Date   CHOL 199 03/25/2015   HDL 65 03/25/2015   LDLCALC 117 (H) 03/25/2015   TRIG 85 03/25/2015   CHOLHDL 3.1 03/25/2015    Assessment:   Healthy female exam.   Overweight H/o Hashiomoto's thyroiditis  Plan:     Blood tests: CBC, CMP ordered today. Has thyroid levels and hormone levels checked by PCP (Functional Medicine).  Breast self exam technique reviewed  and patient encouraged to perform self-exam monthly. Discussed healthy lifestyle modifications. Mammogram ordered. Pap smear up to date.  Next due in 2020.  Up to date on flu vaccine (received at job).  Hashimoto's thyroiditis managed by PCP.  Follow up in 1 year for annual exam     Hildred Laserherry, Ryliegh Mcduffey, MD [facility]

## 2017-08-02 NOTE — Progress Notes (Signed)
Pt is here for her annual exam. Also needs a mammogram order.

## 2017-08-03 LAB — COMPREHENSIVE METABOLIC PANEL
ALBUMIN: 4.9 g/dL (ref 3.5–5.5)
ALT: 23 IU/L (ref 0–32)
AST: 25 IU/L (ref 0–40)
Albumin/Globulin Ratio: 2 (ref 1.2–2.2)
Alkaline Phosphatase: 59 IU/L (ref 39–117)
BILIRUBIN TOTAL: 0.5 mg/dL (ref 0.0–1.2)
BUN / CREAT RATIO: 18 (ref 9–23)
BUN: 17 mg/dL (ref 6–24)
CO2: 20 mmol/L (ref 20–29)
CREATININE: 0.92 mg/dL (ref 0.57–1.00)
Calcium: 9.7 mg/dL (ref 8.7–10.2)
Chloride: 103 mmol/L (ref 96–106)
GFR calc non Af Amer: 76 mL/min/{1.73_m2} (ref >59)
GFR, EST AFRICAN AMERICAN: 88 mL/min/{1.73_m2} (ref >59)
Globulin, Total: 2.5 g/dL (ref 1.5–4.5)
Glucose: 94 mg/dL (ref 65–99)
Potassium: 4.4 mmol/L (ref 3.5–5.2)
Sodium: 141 mmol/L (ref 134–144)
TOTAL PROTEIN: 7.4 g/dL (ref 6.0–8.5)

## 2017-08-03 LAB — CBC
HEMATOCRIT: 41.4 % (ref 34.0–46.6)
HEMOGLOBIN: 13.8 g/dL (ref 11.1–15.9)
MCH: 32 pg (ref 26.6–33.0)
MCHC: 33.3 g/dL (ref 31.5–35.7)
MCV: 96 fL (ref 79–97)
Platelets: 227 10*3/uL (ref 150–379)
RBC: 4.31 x10E6/uL (ref 3.77–5.28)
RDW: 12.6 % (ref 12.3–15.4)
WBC: 4 10*3/uL (ref 3.4–10.8)

## 2017-09-01 DIAGNOSIS — E2839 Other primary ovarian failure: Secondary | ICD-10-CM | POA: Diagnosis not present

## 2017-09-01 DIAGNOSIS — E063 Autoimmune thyroiditis: Secondary | ICD-10-CM | POA: Diagnosis not present

## 2018-02-08 DIAGNOSIS — E2839 Other primary ovarian failure: Secondary | ICD-10-CM | POA: Diagnosis not present

## 2018-02-08 DIAGNOSIS — E063 Autoimmune thyroiditis: Secondary | ICD-10-CM | POA: Diagnosis not present

## 2018-02-19 ENCOUNTER — Ambulatory Visit: Payer: 59 | Admitting: Sports Medicine

## 2018-07-06 DIAGNOSIS — E2839 Other primary ovarian failure: Secondary | ICD-10-CM | POA: Diagnosis not present

## 2018-07-06 DIAGNOSIS — E063 Autoimmune thyroiditis: Secondary | ICD-10-CM | POA: Diagnosis not present

## 2018-07-18 ENCOUNTER — Other Ambulatory Visit: Payer: Self-pay | Admitting: Obstetrics and Gynecology

## 2018-07-18 DIAGNOSIS — Z1231 Encounter for screening mammogram for malignant neoplasm of breast: Secondary | ICD-10-CM

## 2018-08-07 ENCOUNTER — Encounter: Payer: 59 | Admitting: Obstetrics and Gynecology

## 2018-08-23 ENCOUNTER — Inpatient Hospital Stay: Admission: RE | Admit: 2018-08-23 | Payer: Self-pay | Source: Ambulatory Visit

## 2019-01-29 DIAGNOSIS — Q528 Other specified congenital malformations of female genitalia: Secondary | ICD-10-CM | POA: Diagnosis not present

## 2019-01-29 DIAGNOSIS — R5383 Other fatigue: Secondary | ICD-10-CM | POA: Diagnosis not present

## 2019-01-29 DIAGNOSIS — N941 Unspecified dyspareunia: Secondary | ICD-10-CM | POA: Diagnosis not present

## 2019-01-29 DIAGNOSIS — Z01419 Encounter for gynecological examination (general) (routine) without abnormal findings: Secondary | ICD-10-CM | POA: Diagnosis not present

## 2019-01-29 DIAGNOSIS — Z0000 Encounter for general adult medical examination without abnormal findings: Secondary | ICD-10-CM | POA: Diagnosis not present

## 2019-01-29 DIAGNOSIS — Z1239 Encounter for other screening for malignant neoplasm of breast: Secondary | ICD-10-CM | POA: Diagnosis not present

## 2019-03-04 DIAGNOSIS — E063 Autoimmune thyroiditis: Secondary | ICD-10-CM | POA: Diagnosis not present

## 2019-03-04 DIAGNOSIS — E559 Vitamin D deficiency, unspecified: Secondary | ICD-10-CM | POA: Diagnosis not present

## 2019-04-05 ENCOUNTER — Other Ambulatory Visit: Payer: Self-pay

## 2019-04-05 ENCOUNTER — Ambulatory Visit: Payer: 59 | Admitting: Family Medicine

## 2019-04-05 ENCOUNTER — Encounter: Payer: Self-pay | Admitting: Family Medicine

## 2019-04-05 ENCOUNTER — Ambulatory Visit: Payer: Self-pay

## 2019-04-05 VITALS — BP 106/78 | HR 61 | Ht 64.5 in | Wt 151.0 lb

## 2019-04-05 DIAGNOSIS — M7711 Lateral epicondylitis, right elbow: Secondary | ICD-10-CM

## 2019-04-05 DIAGNOSIS — M25521 Pain in right elbow: Secondary | ICD-10-CM

## 2019-04-05 DIAGNOSIS — M7061 Trochanteric bursitis, right hip: Secondary | ICD-10-CM | POA: Diagnosis not present

## 2019-04-05 MED ORDER — PENNSAID 2 % TD SOLN: 2.0000 g | Freq: Two times a day (BID) | TRANSDERMAL | 3 refills | Status: DC

## 2019-04-05 MED ORDER — PENNSAID 2 % TD SOLN: 2.0000 g | Freq: Two times a day (BID) | TRANSDERMAL | 3 refills | Status: AC

## 2019-04-05 NOTE — Progress Notes (Signed)
[facility] [address], [place] [address] Phone: [phone] Subjective:   I Stacey Parker am serving as a Education administrator for Dr. Hulan Saas.   CC: Right elbow pain, right hip pain  IEP:[id number]  Stacey Parker is a 48 y.o. female coming in with complaint of right elbow and right hip pain. Plays tennis. Played 4 days a week and noticed her elbow hurting. Has not been playing tennis. States her pain has been so bad that she couldn't up a glass. Still has some loss of strength. Hip is doing better. Brought some brooks. States she has pain when playing tennis. Elbow pain is worse. States she controls her hip pain with a lot of stretching and better shoes. Lateral hip. Believes her right foot rolls in her shoe. Only has pain with playing tennis as well as the elbow. Still doing pushups and planks. Forearm pain mostly.   Onset- chronic  Location - medial and lateral Duration-  Character- Aggravating factors-increasing activity Reliving factors-decreasing activity has helped Therapies tried- theragun, thera stick, ice, stretching, brace Severity- hip pain 1/2 elbow pain elbow pain Elbow 2     Past Medical History:  Diagnosis Date  . Hashimoto's thyroiditis   . Polycystic ovary    Past Surgical History:  Procedure Laterality Date  . CESAREAN SECTION     x2   Social History   Socioeconomic History  . Marital status: Married    Spouse name: Not on file  . Number of children: Not on file  . Years of education: Not on file  . Highest education level: Not on file  Occupational History  . Not on file  Social Needs  . Financial resource strain: Not on file  . Food insecurity    Worry: Not on file    Inability: Not on file  . Transportation needs    Medical: Not on file    Non-medical: Not on file  Tobacco Use  . Smoking status: Never Smoker  . Smokeless tobacco: Never Used  Substance and Sexual Activity  . Alcohol use: No   Alcohol/week: 0.0 standard drinks  . Drug use: No  . Sexual activity: Yes    Birth control/protection: Condom  Lifestyle  . Physical activity    Days per week: Not on file    Minutes per session: Not on file  . Stress: Not on file  Relationships  . Social Herbalist on phone: Not on file    Gets together: Not on file    Attends religious service: Not on file    Active member of club or organization: Not on file    Attends meetings of clubs or organizations: Not on file    Relationship status: Not on file  Other Topics Concern  . Not on file  Social History Narrative  . Not on file   Allergies  Allergen Reactions  . Sulfa Antibiotics Other (See Comments)   Family History  Problem Relation Age of Onset  . Breast cancer Maternal Aunt 58  . Thyroid disease Mother   . Heart attack Mother   . Prostate cancer Father   . Heart attack Maternal Grandmother   . Prostate cancer Paternal Grandfather     Current Outpatient Medications (Endocrine & Metabolic):  .  Progesterone Micronized (PROGESTERONE PO), Take by mouth. Take on days 12-26 through of cycle .  SYNTHROID 50 MCG tablet,          Past medical  history, social, surgical and family history all reviewed in electronic medical record.  No pertanent information unless stated regarding to the chief complaint.   Review of Systems:  No headache, visual changes, nausea, vomiting, diarrhea, constipation, dizziness, abdominal pain, skin rash, fevers, chills, night sweats, weight loss, swollen lymph nodes, body aches, joint swelling, muscle aches, chest pain, shortness of breath, mood changes.   Objective  There were no vitals taken for this visit. Systems examined below as of    General: No apparent distress alert and oriented x3 mood and affect normal, dressed appropriately.  HEENT: Pupils equal, extraocular movements intact  Respiratory: Patient's speak in full sentences and does not appear short of breath   Cardiovascular: No lower extremity edema, non tender, no erythema  Skin: Warm dry intact with no signs of infection or rash on extremities or on axial skeleton.  Abdomen: Soft nontender  Neuro: Cranial nerves II through XII are intact, neurovascularly intact in all extremities with 2+ DTRs and 2+ pulses.  Lymph: No lymphadenopathy of posterior or anterior cervical chain or axillae bilaterally.  Gait normal with good balance and coordination.  MSK:  tender with limited range of motion and good stability and symmetric strength and tone of shoulders,  wrist,  knee and ankles bilaterally.  Elbow: Right Unremarkable to inspection. Mild limitation in full extension Stable to varus, valgus stress. Negative moving valgus stress test. Tender over the lateral epicondylar region Ulnar nerve does not sublux. Negative cubital tunnel Tinel's. Contralateral elbow unremarkable  Right hip exam has full range of motion but is tender to palpation over the greater trochanteric area with mild tightness with Pearlean BrownieFaber test but does have full range of motion.  Negative straight leg test.  5-5 strength of the lower extremity.  Limited musculoskeletal ultrasound was performed and interpreted by Judi SaaZachary M Smith  Limited ultrasound shows the patient lateral epicondylitis does have some mild hypoechoic changes but no true tear appreciated.  Minimal increase in neovascularization.  No scarring noted.  No effusion of the joint.  Radial head has some what appears to be a remote fracture but appears completely healed.    Impression and Recommendations:     This case required medical decision making of moderate complexity. The above documentation has been reviewed and is accurate and complete Judi SaaZachary M Smith, DO       Note: This dictation was prepared with Dragon dictation along with smaller phrase technology. Any transcriptional errors that result from this process are unintentional.

## 2019-04-05 NOTE — Patient Instructions (Signed)
Wrist brace Exercises 3 times a week.  pennsaid pinkie amount topically 2 times daily as needed.  Tart cherry extract 1200mg  at night Follow up with me in 6 weeks

## 2019-04-05 NOTE — Assessment & Plan Note (Signed)
HEP, icing, topical NSAIDs RTC in 4-6 weeks

## 2019-04-05 NOTE — Assessment & Plan Note (Signed)
Lateral epicondylitis, given home exercises, wrist brace, avoid extension of the wrist, discussed icing regimen, topical anti-inflammatories given, follow-up again in 4 to 6 weeks.  Worsening pain consider nitroglycerin, formal physical therapy as well.

## 2019-05-16 ENCOUNTER — Other Ambulatory Visit: Payer: Self-pay

## 2019-05-16 ENCOUNTER — Ambulatory Visit: Payer: 59 | Admitting: Family Medicine

## 2019-05-16 ENCOUNTER — Encounter: Payer: Self-pay | Admitting: Family Medicine

## 2019-05-16 DIAGNOSIS — M7711 Lateral epicondylitis, right elbow: Secondary | ICD-10-CM

## 2019-05-16 DIAGNOSIS — M999 Biomechanical lesion, unspecified: Secondary | ICD-10-CM

## 2019-05-16 DIAGNOSIS — M7061 Trochanteric bursitis, right hip: Secondary | ICD-10-CM | POA: Diagnosis not present

## 2019-05-16 MED ORDER — DICLOFENAC SODIUM 2 % TD SOLN: 2.0000 g | Freq: Two times a day (BID) | TRANSDERMAL | 3 refills | Status: AC

## 2019-05-16 NOTE — Patient Instructions (Addendum)
[facility] pharmacy [phone] Keep working on the hip Avoid contact with elbow and hard surfaces Continue exercises See me 5-6 weeks

## 2019-05-16 NOTE — Assessment & Plan Note (Signed)
Stable.  Patient declined injection, attempted osteopathic manipulation with some improvement.  Discussed which activities to do which wants to avoid.  Follow-up again in 4 to 8 weeks

## 2019-05-16 NOTE — Assessment & Plan Note (Signed)
Improvement noted, increase activity slowly.  Discussed icing regimen and home exercises, patient will do a different wrap on her tennis racquet.  Follow-up again in 4 to 8 weeks

## 2019-05-16 NOTE — Progress Notes (Signed)
[facility] [address] [place], [place] [address] Phone: [phone] Subjective:   Stacey Parker, am serving as a scribe for Dr. Hulan Saas.  I'm seeing this patient by the request  of:    CC: Right elbow pain follow-up  [id number]   04/05/2019 Lateral epicondylitis, given home exercises, wrist brace, avoid extension of the wrist, discussed icing regimen, topical anti-inflammatories given, follow-up again in 4 to 6 weeks.  Worsening pain consider nitroglycerin, formal physical therapy as well.  Update 05/16/2019 Stacey Parker is a 48 y.o. female coming in with complaint of right elbow pain. Lateral pain has subsided but is now having medial pain which is where her pain started. Patient feels that office set up at home is increasing her pain. Tennis also increased her pain. Was wearing the brace for one month.   Patient also has felt improvement with hip if she stretches. Still does have intermittent sharp pain over the greater trochanter.     Past Medical History:  Diagnosis Date  . Hashimoto's thyroiditis   . Polycystic ovary    Past Surgical History:  Procedure Laterality Date  . CESAREAN SECTION     x2   Social History   Socioeconomic History  . Marital status: Married    Spouse name: Not on file  . Number of children: Not on file  . Years of education: Not on file  . Highest education level: Not on file  Occupational History  . Not on file  Social Needs  . Financial resource strain: Not on file  . Food insecurity    Worry: Not on file    Inability: Not on file  . Transportation needs    Medical: Not on file    Non-medical: Not on file  Tobacco Use  . Smoking status: Never Smoker  . Smokeless tobacco: Never Used  Substance and Sexual Activity  . Alcohol use: Parker    Alcohol/week: 0.0 standard drinks  . Drug use: Parker  . Sexual activity: Yes    Birth control/protection: Condom  Lifestyle  . Physical activity   Days per week: Not on file    Minutes per session: Not on file  . Stress: Not on file  Relationships  . Social Herbalist on phone: Not on file    Gets together: Not on file    Attends religious service: Not on file    Active member of club or organization: Not on file    Attends meetings of clubs or organizations: Not on file    Relationship status: Not on file  Other Topics Concern  . Not on file  Social History Narrative  . Not on file   Allergies  Allergen Reactions  . Sulfa Antibiotics Other (See Comments)   Family History  Problem Relation Age of Onset  . Breast cancer Maternal Aunt 58  . Thyroid disease Mother   . Heart attack Mother   . Prostate cancer Father   . Heart attack Maternal Grandmother   . Prostate cancer Paternal Grandfather     Current Outpatient Medications (Endocrine & Metabolic):  .  Progesterone Micronized (PROGESTERONE PO), Take by mouth. Take on days 12-26 through of cycle .  SYNTHROID 50 MCG tablet,       Current Outpatient Medications (Other):  [name]  Diclofenac Sodium (PENNSAID) 2 % SOLN, Place 2 g onto the skin 2 (two) times daily. .  Diclofenac Sodium 2 % SOLN, Place 2 g  onto the skin 2 (two) times daily.    Past medical history, social, surgical and family history all reviewed in electronic medical record.  Parker pertanent information unless stated regarding to the chief complaint.   Review of Systems:  Parker headache, visual changes, nausea, vomiting, diarrhea, constipation, dizziness, abdominal pain, skin rash, fevers, chills, night sweats, weight loss, swollen lymph nodes, body aches, joint swelling, muscle aches, chest pain, shortness of breath, mood changes.   Objective  Height 5' 4.5" (1.638 m), weight 146 lb (66.2 kg).    General: Parker apparent distress alert and oriented x3 mood and affect normal, dressed appropriately.  HEENT: Pupils equal, extraocular movements intact  Respiratory: Patient's speak in full sentences and  does not appear short of breath  Cardiovascular: Parker lower extremity edema, non tender, Parker erythema  Skin: Warm dry intact with Parker signs of infection or rash on extremities or on axial skeleton.  Abdomen: Soft nontender  Neuro: Cranial nerves II through XII are intact, neurovascularly intact in all extremities with 2+ DTRs and 2+ pulses.  Lymph: Parker lymphadenopathy of posterior or anterior cervical chain or axillae bilaterally.  Gait normal with good balance and coordination.  MSK:  Non tender with full range of motion and good stability and symmetric strength and tone of shoulders, , wrist, hip, knee and ankles bilaterally.  Right ankle still mild pain overall.  Nothing severe.  Mild pain over the lateral epicondylar region but improvement in range of motion.  Right hip exam shows some tenderness over the greater trochanteric area.  Positive Pearlean Brownie.  Negative straight leg test.  Tightness around the right sacroiliac joint.  Osteopathic findings C5 flexed rotated and side bent left T4 extended rotated and side bent right inhaled rib T9 extended rotated and side bent left L1 flexed rotated and side bent right Sacrum right on right    Impression and Recommendations:     This case required medical decision making of moderate complexity. The above documentation has been reviewed and is accurate and complete Judi Saa, DO       Note: This dictation was prepared with Dragon dictation along with smaller phrase technology. Any transcriptional errors that result from this process are unintentional.

## 2019-05-16 NOTE — Assessment & Plan Note (Signed)
Decision today to treat with OMT was based on Physical Exam  After verbal consent patient was treated with HVLA, ME, FPR techniques in  thoracic, lumbar and sacral areas  Patient tolerated the procedure well with improvement in symptoms  Patient given exercises, stretches and lifestyle modifications  See medications in patient instructions if given  Patient will follow up in 4-8 weeks 

## 2019-06-17 ENCOUNTER — Ambulatory Visit: Payer: 59 | Admitting: Family Medicine

## 2019-07-04 DIAGNOSIS — H524 Presbyopia: Secondary | ICD-10-CM | POA: Diagnosis not present

## 2019-07-05 DIAGNOSIS — J340 Abscess, furuncle and carbuncle of nose: Secondary | ICD-10-CM | POA: Diagnosis not present

## 2019-07-11 ENCOUNTER — Ambulatory Visit: Payer: 59 | Admitting: Family Medicine

## 2019-07-11 ENCOUNTER — Other Ambulatory Visit: Payer: Self-pay

## 2019-07-11 ENCOUNTER — Encounter: Payer: Self-pay | Admitting: Family Medicine

## 2019-07-11 VITALS — BP 118/82 | HR 70 | Ht 64.5 in | Wt 146.0 lb

## 2019-07-11 DIAGNOSIS — M7061 Trochanteric bursitis, right hip: Secondary | ICD-10-CM | POA: Diagnosis not present

## 2019-07-11 DIAGNOSIS — M999 Biomechanical lesion, unspecified: Secondary | ICD-10-CM

## 2019-07-11 MED ORDER — MELOXICAM 7.5 MG PO TABS: 7.5000 mg | ORAL_TABLET | Freq: Every day | ORAL | 0 refills | Status: AC

## 2019-07-11 NOTE — Assessment & Plan Note (Signed)
Decision today to treat with OMT was based on Physical Exam  After verbal consent patient was treated with HVLA, ME, FPR techniques in , thoracic, lumbar and sacral areas  Patient tolerated the procedure well with improvement in symptoms  Patient given exercises, stretches and lifestyle modifications  See medications in patient instructions if given  Patient will follow up in 4-8 weeks 

## 2019-07-11 NOTE — Patient Instructions (Addendum)
  [address], [address] [place], [place] [address] Phone [phone]  Meloxicam daily for 10 days Consider PRP See me again in 4-6 weeks

## 2019-07-11 NOTE — Assessment & Plan Note (Signed)
Continue gluteal tendinitis.  Discussed which activities near which wants to avoid.  Discussed which activities to do which wants to avoid patient continues wants to try conservative therapy.

## 2019-07-11 NOTE — Progress Notes (Signed)
[facility] [place], [place] [address] Phone: [phone] Subjective:   Stacey Parker, am serving as a scribe for Dr. Hulan Saas.  This visit occurred during the SARS-CoV-2 public health emergency.  Safety protocols were in place, including screening questions prior to the visit, additional usage of staff PPE, and extensive cleaning of exam room while observing appropriate contact time as indicated for disinfecting solutions.    CC: Right hip and back pain  [id number]   06/16/2019 Stable.  Patient declined injection, attempted osteopathic manipulation with some improvement.  Discussed which activities to do which wants to avoid.  Follow-up again in 4 to 8 weeks  Improvement noted, increase activity slowly.  Discussed icing regimen and home exercises, patient will do a different wrap on her tennis racquet.  Follow-up again in 4 to 8 weeks  Update 07/11/2019 Stacey Parker is a 48 y.o. female coming in with complaint of right elbow and right hip pain. Patient states that her elbow pain is better than last visit.   Continues to have right hip pain. Pennsaid does help with pain. Pain is constant due to sitting more for work during [employer].      Past Medical History:  Diagnosis Date  . Hashimoto's thyroiditis   . Polycystic ovary    Past Surgical History:  Procedure Laterality Date  . CESAREAN SECTION     x2   Social History   Socioeconomic History  . Marital status: Married    Spouse name: Not on file  . Number of children: Not on file  . Years of education: Not on file  . Highest education level: Not on file  Occupational History  . Not on file  Tobacco Use  . Smoking status: Never Smoker  . Smokeless tobacco: Never Used  Substance and Sexual Activity  . Alcohol use: Parker    Alcohol/week: 0.0 standard drinks  . Drug use: Parker  . Sexual activity: Yes    Birth control/protection: Condom  Other Topics Concern  . Not  on file  Social History Narrative  . Not on file   Social Determinants of Health   Financial Resource Strain:   . Difficulty of Paying Living Expenses: Not on file  Food Insecurity:   . Worried About Charity fundraiser in the Last Year: Not on file  . Ran Out of Food in the Last Year: Not on file  Transportation Needs:   . Lack of Transportation (Medical): Not on file  . Lack of Transportation (Non-Medical): Not on file  Physical Activity:   . Days of Exercise per Week: Not on file  . Minutes of Exercise per Session: Not on file  Stress:   . Feeling of Stress : Not on file  Social Connections:   . Frequency of Communication with Friends and Family: Not on file  . Frequency of Social Gatherings with Friends and Family: Not on file  . Attends Religious Services: Not on file  . Active Member of Clubs or Organizations: Not on file  . Attends Archivist Meetings: Not on file  . Marital Status: Not on file   Allergies  Allergen Reactions  . Sulfa Antibiotics Other (See Comments)   Family History  Problem Relation Age of Onset  . Breast cancer Maternal Aunt 58  . Thyroid disease Mother   . Heart attack Mother   . Prostate cancer Father   . Heart attack Maternal Grandmother   . Prostate  cancer Paternal Grandfather     Current Outpatient Medications (Endocrine & Metabolic):  .  Progesterone Micronized (PROGESTERONE PO), Take by mouth. Take on days 12-26 through of cycle .  SYNTHROID 50 MCG tablet,     Current Outpatient Medications (Analgesics):  .  meloxicam (MOBIC) 7.5 MG tablet, Take 1 tablet (7.5 mg total) by mouth daily.   Current Outpatient Medications (Other):  [name]  Diclofenac Sodium (PENNSAID) 2 % SOLN, Place 2 g onto the skin 2 (two) times daily. .  Diclofenac Sodium 2 % SOLN, Place 2 g onto the skin 2 (two) times daily.    Past medical history, social, surgical and family history all reviewed in electronic medical record.  Parker pertanent information  unless stated regarding to the chief complaint.   Review of Systems:  Parker headache, visual changes, nausea, vomiting, diarrhea, constipation, dizziness, abdominal pain, skin rash, fevers, chills, night sweats, weight loss, swollen lymph nodes, body aches, joint swelling, muscle aches, chest pain, shortness of breath, mood changes.   Objective  Blood pressure 118/82, pulse 70, height 5' 4.5" (1.638 m), weight 146 lb (66.2 kg), SpO2 98 %.    General: Parker apparent distress alert and oriented x3 mood and affect normal, dressed appropriately.  HEENT: Pupils equal, extraocular movements intact  Respiratory: Patient's speak in full sentences and does not appear short of breath  Cardiovascular: Parker lower extremity edema, non tender, Parker erythema  Skin: Warm dry intact with Parker signs of infection or rash on extremities or on axial skeleton.  Abdomen: Soft nontender  Neuro: Cranial nerves II through XII are intact, neurovascularly intact in all extremities with 2+ DTRs and 2+ pulses.  Lymph: Parker lymphadenopathy of posterior or anterior cervical chain or axillae bilaterally.  Gait normal with good balance and coordination.  MSK:  Non tender with full range of motion and good stability and symmetric strength and tone of shoulders, elbows, wrist, hip, knee and ankles bilaterally.  Back exam does have some mild loss of lordosis.  Patient does have a positive Pearlean Brownie on the right side.  Pain over the piriformis as well as the gluteal tendon on the right side.  Negative straight leg test.  Minimal discomfort in the paraspinal musculature lumbar spine right greater than left  Osteopathic findings T6 extended rotated and side bent left L2 flexed rotated and side bent right Sacrum right on right    Impression and Recommendations:     This case required medical decision making of moderate complexity. The above documentation has been reviewed and is accurate and complete Judi Saa, DO       Note: This  dictation was prepared with Dragon dictation along with smaller phrase technology. Any transcriptional errors that result from this process are unintentional.

## 2019-08-15 ENCOUNTER — Ambulatory Visit: Payer: 59 | Admitting: Family Medicine

## 2019-08-15 ENCOUNTER — Other Ambulatory Visit: Payer: Self-pay

## 2019-08-15 ENCOUNTER — Encounter: Payer: Self-pay | Admitting: Family Medicine

## 2019-08-15 VITALS — BP 100/70 | HR 74 | Ht 64.0 in | Wt 146.0 lb

## 2019-08-15 DIAGNOSIS — M999 Biomechanical lesion, unspecified: Secondary | ICD-10-CM | POA: Diagnosis not present

## 2019-08-15 DIAGNOSIS — M7061 Trochanteric bursitis, right hip: Secondary | ICD-10-CM | POA: Diagnosis not present

## 2019-08-15 NOTE — Assessment & Plan Note (Signed)
Patient is doing a little bit better at this time.  Discussed the possibility of injection but I do not think will be necessary.  Discussed icing regimen and home exercises, discussed which activities to do which wants to avoid.  Patient will increase activity slowly over the course the next several weeks.  Follow-up again in 4 to 8 weeks

## 2019-08-15 NOTE — Assessment & Plan Note (Signed)
Decision today to treat with OMT was based on Physical Exam  After verbal consent patient was treated with HVLA, ME, FPR techniques in  thoracic, lumbar and sacral areas  Patient tolerated the procedure well with improvement in symptoms  Patient given exercises, stretches and lifestyle modifications  See medications in patient instructions if given  Patient will follow up in 4-6 weeks 

## 2019-08-15 NOTE — Progress Notes (Signed)
[facility] [address] [place] Phone: [phone] Subjective:   I Stacey Parker am serving as a Education administrator for Dr. Hulan Saas.  This visit occurred during the SARS-CoV-2 public health emergency.  Safety protocols were in place, including screening questions prior to the visit, additional usage of staff PPE, and extensive cleaning of exam room while observing appropriate contact time as indicated for disinfecting solutions.   I'm seeing this patient by the request  of:    CC: Hip pain, back pain follow-up  [id number]   07/11/2019 Continue gluteal tendinitis.  Discussed which activities near which wants to avoid.  Discussed which activities to do which wants to avoid patient continues wants to try conservative therapy.  Update 08/15/2019 Stacey Parker is a 49 y.o. female coming in with complaint of back and hip pain. Last seen on 07/11/2019 for OMT. Patient states she is feeling pretty good. Some nerve pain in the elbow. Hip was better but now the pain is intermitted. Has discontinued yoga.  Patient states overall doing relatively well.  Does feel that stretching still seems to irritate more of the side of the hip.  Did respond extremely well to osteopathic manipulation previously.      Past Medical History:  Diagnosis Date  . Hashimoto's thyroiditis   . Polycystic ovary    Past Surgical History:  Procedure Laterality Date  . CESAREAN SECTION     x2   Social History   Socioeconomic History  . Marital status: Married    Spouse name: Not on file  . Number of children: Not on file  . Years of education: Not on file  . Highest education level: Not on file  Occupational History  . Not on file  Tobacco Use  . Smoking status: Never Smoker  . Smokeless tobacco: Never Used  Substance and Sexual Activity  . Alcohol use: No    Alcohol/week: 0.0 standard drinks  . Drug use: No  . Sexual activity: Yes    Birth  control/protection: Condom  Other Topics Concern  . Not on file  Social History Narrative  . Not on file   Social Determinants of Health   Financial Resource Strain:   . Difficulty of Paying Living Expenses: Not on file  Food Insecurity:   . Worried About Charity fundraiser in the Last Year: Not on file  . Ran Out of Food in the Last Year: Not on file  Transportation Needs:   . Lack of Transportation (Medical): Not on file  . Lack of Transportation (Non-Medical): Not on file  Physical Activity:   . Days of Exercise per Week: Not on file  . Minutes of Exercise per Session: Not on file  Stress:   . Feeling of Stress : Not on file  Social Connections:   . Frequency of Communication with Friends and Family: Not on file  . Frequency of Social Gatherings with Friends and Family: Not on file  . Attends Religious Services: Not on file  . Active Member of Clubs or Organizations: Not on file  . Attends Archivist Meetings: Not on file  . Marital Status: Not on file   Allergies  Allergen Reactions  . Sulfa Antibiotics Other (See Comments)   Family History  Problem Relation Age of Onset  . Breast cancer Maternal Aunt 58  . Thyroid disease Mother   . Heart attack Mother   . Prostate cancer Father   . Heart attack Maternal  Grandmother   . Prostate cancer Paternal Grandfather     Current Outpatient Medications (Endocrine & Metabolic):  .  Progesterone Micronized (PROGESTERONE PO), Take by mouth. Take on days 12-26 through of cycle .  SYNTHROID 50 MCG tablet,     Current Outpatient Medications (Analgesics):  .  meloxicam (MOBIC) 7.5 MG tablet, Take 1 tablet (7.5 mg total) by mouth daily.   Current Outpatient Medications (Other):  [name]  Diclofenac Sodium (PENNSAID) 2 % SOLN, Place 2 g onto the skin 2 (two) times daily. .  Diclofenac Sodium 2 % SOLN, Place 2 g onto the skin 2 (two) times daily.    Past medical history, social, surgical and family history all reviewed  in electronic medical record.  No pertanent information unless stated regarding to the chief complaint.   Review of Systems:  No headache, visual changes, nausea, vomiting, diarrhea, constipation, dizziness, abdominal pain, skin rash, fevers, chills, night sweats, weight loss, swollen lymph nodes, body aches, joint swelling, muscle aches, chest pain, shortness of breath, mood changes.   Objective  Blood pressure 100/70, pulse 74, height 5\' 4"  (1.626 m), weight 146 lb (66.2 kg), SpO2 97 %.    General: No apparent distress alert and oriented x3 mood and affect normal, dressed appropriately.  HEENT: Pupils equal, extraocular movements intact  Respiratory: Patient's speak in full sentences and does not appear short of breath  Cardiovascular: No lower extremity edema, non tender, no erythema  Skin: Warm dry intact with no signs of infection or rash on extremities or on axial skeleton.  Abdomen: Soft nontender  Neuro: Cranial nerves II through XII are intact, neurovascularly intact in all extremities with 2+ DTRs and 2+ pulses.  Lymph: No lymphadenopathy of posterior or anterior cervical chain or axillae bilaterally.  Gait normal with good balance and coordination.  MSK:  Non tender with full range of motion and good stability and symmetric strength and tone of shoulders, elbows, wrist, hip, knee and ankles bilaterally.  Back exam shows the patient does have some mild tenderness to palpation around the right sacroiliac joint.  Mild tightness with right mild pain in the piriformis.  Osteopathic findings  T6 extended rotated and side bent left L4 flexed rotated and side bent left  Sacrum right on right    Impression and Recommendations:     This case required medical decision making of moderate complexity. The above documentation has been reviewed and is accurate and complete Pearlean Brownie, DO       Note: This dictation was prepared with Dragon dictation along with smaller phrase  technology. Any transcriptional errors that result from this process are unintentional.

## 2019-08-15 NOTE — Patient Instructions (Signed)
Good to see you Continue to improve Keep working on hip strengthening See me again in 5-6 weeks

## 2019-09-02 DIAGNOSIS — E063 Autoimmune thyroiditis: Secondary | ICD-10-CM | POA: Diagnosis not present

## 2019-09-02 DIAGNOSIS — K589 Irritable bowel syndrome without diarrhea: Secondary | ICD-10-CM | POA: Diagnosis not present

## 2019-09-20 ENCOUNTER — Ambulatory Visit: Payer: 59 | Admitting: Family Medicine

## 2019-10-03 ENCOUNTER — Other Ambulatory Visit: Payer: Self-pay | Admitting: Obstetrics & Gynecology

## 2019-10-03 DIAGNOSIS — Z1231 Encounter for screening mammogram for malignant neoplasm of breast: Secondary | ICD-10-CM

## 2019-10-08 ENCOUNTER — Ambulatory Visit (INDEPENDENT_AMBULATORY_CARE_PROVIDER_SITE_OTHER): Payer: 59 | Admitting: Family Medicine

## 2019-10-08 ENCOUNTER — Other Ambulatory Visit: Payer: Self-pay

## 2019-10-08 ENCOUNTER — Encounter: Payer: Self-pay | Admitting: Family Medicine

## 2019-10-08 VITALS — BP 104/72 | HR 78 | Ht 64.0 in | Wt 150.0 lb

## 2019-10-08 DIAGNOSIS — M7061 Trochanteric bursitis, right hip: Secondary | ICD-10-CM | POA: Diagnosis not present

## 2019-10-08 DIAGNOSIS — M999 Biomechanical lesion, unspecified: Secondary | ICD-10-CM | POA: Diagnosis not present

## 2019-10-08 NOTE — Assessment & Plan Note (Signed)
Decision today to treat with OMT was based on Physical Exam  After verbal consent patient was treated with HVLA, ME, FPR techniques in  thoracic, lumbar and sacral areas  Patient tolerated the procedure well with improvement in symptoms  Patient given exercises, stretches and lifestyle modifications  See medications in patient instructions if given  Patient will follow up in 4-8 weeks 

## 2019-10-08 NOTE — Patient Instructions (Signed)
Ok to do yoga and tennis 3x a week See me in 7-8 weeks

## 2019-10-08 NOTE — Progress Notes (Signed)
[name] [address] [place] [address] Phone: [phone] Subjective:   Stacey Parker, am serving as a scribe for Dr. Antoine Primas. This visit occurred during the SARS-CoV-2 public health emergency.  Safety protocols were in place, including screening questions prior to the visit, additional usage of staff PPE, and extensive cleaning of exam room while observing appropriate contact time as indicated for disinfecting solutions.   I'm seeing this patient by the request  of:  Patient, No Pcp Per  CC: Hip and back pain follow-up  [id number]   08/15/2019 Patient is doing a little bit better at this time.  Discussed the possibility of injection but I do not think will be necessary.  Discussed icing regimen and home exercises, discussed which activities to do which wants to avoid. Patient will increase activity slowly over the course the next several weeks.  Follow-up again in 4 to 8 weeks  Update 10/08/2019 Stacey Parker is a 49 y.o. female coming in with complaint of back pain and right hip pain. Last seen on 08/15/2019 for OMT. Patient states that she is doing better. Has not been back to yoga or tennis. Feels she is ready to return though.       Past Medical History:  Diagnosis Date  . Hashimoto's thyroiditis   . Polycystic ovary    Past Surgical History:  Procedure Laterality Date  . CESAREAN SECTION     x2   Social History   Socioeconomic History  . Marital status: Married    Spouse name: Not on file  . Number of children: Not on file  . Years of education: Not on file  . Highest education level: Not on file  Occupational History  . Not on file  Tobacco Use  . Smoking status: Never Smoker  . Smokeless tobacco: Never Used  Substance and Sexual Activity  . Alcohol use: No    Alcohol/week: 0.0 standard drinks  . Drug use: No  . Sexual activity: Yes    Birth control/protection: Condom  Other Topics Concern  . Not on  file  Social History Narrative  . Not on file   Social Determinants of Health   Financial Resource Strain:   . Difficulty of Paying Living Expenses: Not on file  Food Insecurity:   . Worried About Programme researcher, broadcasting/film/video in the Last Year: Not on file  . Ran Out of Food in the Last Year: Not on file  Transportation Needs:   . Lack of Transportation (Medical): Not on file  . Lack of Transportation (Non-Medical): Not on file  Physical Activity:   . Days of Exercise per Week: Not on file  . Minutes of Exercise per Session: Not on file  Stress:   . Feeling of Stress : Not on file  Social Connections:   . Frequency of Communication with Friends and Family: Not on file  . Frequency of Social Gatherings with Friends and Family: Not on file  . Attends Religious Services: Not on file  . Active Member of Clubs or Organizations: Not on file  . Attends Banker Meetings: Not on file  . Marital Status: Not on file   Allergies  Allergen Reactions  . Sulfa Antibiotics Other (See Comments)   Family History  Problem Relation Age of Onset  . Breast cancer Maternal Aunt 58  . Thyroid disease Mother   . Heart attack Mother   . Prostate cancer Father   . Heart attack  Maternal Grandmother   . Prostate cancer Paternal Grandfather     Current Outpatient Medications (Endocrine & Metabolic):  .  Progesterone Micronized (PROGESTERONE PO), Take by mouth. Take on days 12-26 through of cycle .  SYNTHROID 50 MCG tablet,     Current Outpatient Medications (Analgesics):  .  meloxicam (MOBIC) 7.5 MG tablet, Take 1 tablet (7.5 mg total) by mouth daily.   Current Outpatient Medications (Other):  [name]  Diclofenac Sodium (PENNSAID) 2 % SOLN, Place 2 g onto the skin 2 (two) times daily. .  Diclofenac Sodium 2 % SOLN, Place 2 g onto the skin 2 (two) times daily.   Reviewed prior external information including notes and imaging from  primary care provider As well as notes that were available  from care everywhere and other healthcare systems.  Past medical history, social, surgical and family history all reviewed in electronic medical record.  No pertanent information unless stated regarding to the chief complaint.   Review of Systems:  No headache, visual changes, nausea, vomiting, diarrhea, constipation, dizziness, abdominal pain, skin rash, fevers, chills, night sweats, weight loss, swollen lymph nodes, body aches, joint swelling, chest pain, shortness of breath, mood changes. POSITIVE muscle aches  Objective  Blood pressure 104/72, pulse 78, height 5\' 4"  (1.626 m), weight 150 lb (68 kg), SpO2 98 %.   General: No apparent distress alert and oriented x3 mood and affect normal, dressed appropriately.  HEENT: Pupils equal, extraocular movements intact  Respiratory: Patient's speak in full sentences and does not appear short of breath  Cardiovascular: No lower extremity edema, non tender, no erythema  Skin: Warm dry intact with no signs of infection or rash on extremities or on axial skeleton.  Abdomen: Soft nontender  Neuro: Cranial nerves II through XII are intact, neurovascularly intact in all extremities with 2+ DTRs and 2+ pulses.  Lymph: No lymphadenopathy of posterior or anterior cervical chain or axillae bilaterally.  Gait normal with good balance and coordination.  MSK:  Non tender with full range of motion and good stability and symmetric strength and tone of shoulders, elbows, wrist, knee and ankles bilaterally.   Back exam does have some mild loss of lordosis.  Tender to palpation of paraspinal musculature.  Negative straight leg test.  Near full range of motion.  Still some discomfort over the lateral aspect of the hip.  5 out of 5 strength of the lower extremities are noted.  Osteopathic findings  T5 extended rotated and side bent left L2 flexed rotated and side bent right Sacrum right on right    Impression and Recommendations:     This case required medical  decision making of moderate complexity. The above documentation has been reviewed and is accurate and complete Lyndal Pulley, DO       Note: This dictation was prepared with Dragon dictation along with smaller phrase technology. Any transcriptional errors that result from this process are unintentional.

## 2019-10-08 NOTE — Assessment & Plan Note (Signed)
Overall improvement.  We discussed icing regimen and home exercises.  We discussed the core strengthening hip abductor strengthening that I think is more beneficial.  Patient will follow up with me again in 4-8

## 2019-10-28 ENCOUNTER — Ambulatory Visit
Admission: RE | Admit: 2019-10-28 | Discharge: 2019-10-28 | Disposition: A | Discharge status: Home / Self Care | Payer: 59 | Source: Ambulatory Visit | Attending: Obstetrics & Gynecology | Admitting: Obstetrics & Gynecology

## 2019-10-28 DIAGNOSIS — Z1231 Encounter for screening mammogram for malignant neoplasm of breast: Secondary | ICD-10-CM | POA: Diagnosis not present

## 2019-11-01 DIAGNOSIS — E063 Autoimmune thyroiditis: Secondary | ICD-10-CM | POA: Diagnosis not present

## 2019-11-26 ENCOUNTER — Other Ambulatory Visit: Payer: Self-pay

## 2019-11-26 ENCOUNTER — Encounter: Payer: Self-pay | Admitting: Family Medicine

## 2019-11-26 ENCOUNTER — Ambulatory Visit: Payer: 59 | Admitting: Family Medicine

## 2019-11-26 VITALS — BP 92/60 | HR 58 | Ht 64.0 in | Wt 151.0 lb

## 2019-11-26 DIAGNOSIS — M999 Biomechanical lesion, unspecified: Secondary | ICD-10-CM | POA: Diagnosis not present

## 2019-11-26 DIAGNOSIS — M545 Low back pain, unspecified: Secondary | ICD-10-CM

## 2019-11-26 DIAGNOSIS — G8929 Other chronic pain: Secondary | ICD-10-CM | POA: Diagnosis not present

## 2019-11-26 NOTE — Assessment & Plan Note (Signed)

## 2019-11-26 NOTE — Patient Instructions (Addendum)
Good to see you [place], addias tennis or ultra See me again in 6-8 weeks

## 2019-11-26 NOTE — Progress Notes (Signed)
[facility] [address] [place] Phone: [phone] Subjective:   I Stacey Parker am serving as a Education administrator for Dr. Hulan Saas.  This visit occurred during the SARS-CoV-2 public health emergency.  Safety protocols were in place, including screening questions prior to the visit, additional usage of staff PPE, and extensive cleaning of exam room while observing appropriate contact time as indicated for disinfecting solutions.   I'm seeing this patient by the request  of:  Patient, No Pcp Per  CC: Back pain follow-up  [id number]  Stacey Parker is a 49 y.o. female coming in with complaint of back pain. Last seen 10/08/2019 for OMT. Patient states she was doing good for a while until last week. Lower back and hip pain. Achy pain in the hip. Patient is exercising and walking. Still hasn't played tennis due to ADL.      Past Medical History:  Diagnosis Date  . Hashimoto's thyroiditis   . Polycystic ovary    Past Surgical History:  Procedure Laterality Date  . CESAREAN SECTION     x2   Social History   Socioeconomic History  . Marital status: Married    Spouse name: Not on file  . Number of children: Not on file  . Years of education: Not on file  . Highest education level: Not on file  Occupational History  . Not on file  Tobacco Use  . Smoking status: Never Smoker  . Smokeless tobacco: Never Used  Substance and Sexual Activity  . Alcohol use: No    Alcohol/week: 0.0 standard drinks  . Drug use: No  . Sexual activity: Yes    Birth control/protection: Condom  Other Topics Concern  . Not on file  Social History Narrative  . Not on file   Social Determinants of Health   Financial Resource Strain:   . Difficulty of Paying Living Expenses:   Food Insecurity:   . Worried About Charity fundraiser in the Last Year:   . Arboriculturist in the Last Year:   Transportation Needs:   . Film/video editor (Medical):     [name] Lack of Transportation (Non-Medical):   Physical Activity:   . Days of Exercise per Week:   . Minutes of Exercise per Session:   Stress:   . Feeling of Stress :   Social Connections:   . Frequency of Communication with Friends and Family:   . Frequency of Social Gatherings with Friends and Family:   . Attends Religious Services:   . Active Member of Clubs or Organizations:   . Attends Archivist Meetings:   [name] Marital Status:    Allergies  Allergen Reactions  . Sulfa Antibiotics Other (See Comments)   Family History  Problem Relation Age of Onset  . Breast cancer Maternal Aunt 58  . Thyroid disease Mother   . Heart attack Mother   . Prostate cancer Father   . Heart attack Maternal Grandmother   . Prostate cancer Paternal Grandfather     Current Outpatient Medications (Endocrine & Metabolic):  .  Progesterone Micronized (PROGESTERONE PO), Take by mouth. Take on days 12-26 through of cycle .  SYNTHROID 50 MCG tablet,     Current Outpatient Medications (Analgesics):  .  meloxicam (MOBIC) 7.5 MG tablet, Take 1 tablet (7.5 mg total) by mouth daily.   Current Outpatient Medications (Other):  [name]  Diclofenac Sodium (PENNSAID) 2 % SOLN, Place 2 g onto the  skin 2 (two) times daily. .  Diclofenac Sodium 2 % SOLN, Place 2 g onto the skin 2 (two) times daily.   Reviewed prior external information including notes and imaging from  primary care provider As well as notes that were available from care everywhere and other healthcare systems.  Past medical history, social, surgical and family history all reviewed in electronic medical record.  No pertanent information unless stated regarding to the chief complaint.   Review of Systems:  No headache, visual changes, nausea, vomiting, diarrhea, constipation, dizziness, abdominal pain, skin rash, fevers, chills, night sweats, weight loss, swollen lymph nodes, body aches, joint swelling, chest pain, shortness of breath, mood  changes. POSITIVE muscle aches  Objective  Blood pressure 92/60, pulse (!) 58, height 5\' 4"  (1.626 m), weight 151 lb (68.5 kg), last menstrual period 10/28/2019, SpO2 97 %.   General: No apparent distress alert and oriented x3 mood and affect normal, dressed appropriately.  HEENT: Pupils equal, extraocular movements intact  Respiratory: Patient's speak in full sentences and does not appear short of breath  Cardiovascular: No lower extremity edema, non tender, no erythema  Neuro: Cranial nerves II through XII are intact, neurovascularly intact in all extremities with 2+ DTRs and 2+ pulses.  Gait normal with good balance and coordination.  MSK:  Non tender with full range of motion and good stability and symmetric strength and tone of shoulders, elbows, wrist, hip, knee and ankles bilaterally.  Back exam some mild tightness noted in the thoracolumbar juncture and minorly over the paraspinal musculature lumbar spine.  Thoracolumbar seems to be worse.  Osteopathic findings C2 flexed rotated and side bent right C6 flexed rotated and side bent left T3 extended rotated and side bent right inhaled third rib T6 extended rotated and side bent left L2 flexed rotated and side bent right Sacrum right on right    Impression and Recommendations:     This case required medical decision making of moderate complexity. The above documentation has been reviewed and is accurate and complete 10/30/2019, DO       Note: This dictation was prepared with Dragon dictation along with smaller phrase technology. Any transcriptional errors that result from this process are unintentional.

## 2019-11-26 NOTE — Assessment & Plan Note (Signed)
Chronic problem but does seem to be stable.  Discussed again about the medication management including meloxicam.  Patient is responding well to manipulation.  Topical anti-inflammatories if needed.  Discussed icing regimen.  Follow-up again in 4 to 8 weeks

## 2019-12-31 ENCOUNTER — Telehealth: Payer: Self-pay | Admitting: Family Medicine

## 2019-12-31 NOTE — Telephone Encounter (Signed)
Pt is just back to playing tennis and noticed the same pain as before. She completed wearing the brace and does her exercises/ice, but can tell her elbow will be problematic with tennis.  Do we recommend a wrist brace or tennis elbow brace that she could wear while playing tennis?

## 2019-12-31 NOTE — Telephone Encounter (Signed)
Sent My-Chart message

## 2020-01-01 NOTE — Telephone Encounter (Signed)
Pt given MD response from MyChart.

## 2020-01-14 ENCOUNTER — Other Ambulatory Visit: Payer: Self-pay

## 2020-01-14 ENCOUNTER — Ambulatory Visit: Payer: 59 | Admitting: Family Medicine

## 2020-01-14 ENCOUNTER — Encounter: Payer: Self-pay | Admitting: Family Medicine

## 2020-01-14 DIAGNOSIS — M7711 Lateral epicondylitis, right elbow: Secondary | ICD-10-CM | POA: Diagnosis not present

## 2020-01-14 DIAGNOSIS — M999 Biomechanical lesion, unspecified: Secondary | ICD-10-CM

## 2020-01-14 MED ORDER — NITROGLYCERIN 0.2 MG/HR TD PT24: MEDICATED_PATCH | TRANSDERMAL | 0 refills | Status: DC

## 2020-01-14 NOTE — Assessment & Plan Note (Signed)
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  Topical anti-inflammatories and meloxicam.  I am hoping patient does relatively well overall.  We discussed bracing with certain areas as well as ergonomics at work and might be more beneficial.  Continued problems I would like to doing ultrasound with a questionable injection or discussed PRP.

## 2020-01-14 NOTE — Patient Instructions (Signed)
Nitroglycerin Protocol   Apply 1/4 nitroglycerin patch to affected area daily.  Change position of patch within the affected area every 24 hours.  You may experience a headache during the first 1-2 weeks of using the patch, these should subside.  If you experience headaches after beginning nitroglycerin patch treatment, you may take your preferred over the counter pain reliever.  Another side effect of the nitroglycerin patch is skin irritation or rash related to patch adhesive.  Please notify our office if you develop more severe headaches or rash, and stop the patch.  Tendon healing with nitroglycerin patch may require 12 to 24 weeks depending on the extent of injury.  Men should not use if taking Viagra, Cialis, or Levitra.   Do not use if you have migraines or rosacea.  Veritcal mouse Wear wrist brace at night See me 7-8 weeks

## 2020-01-14 NOTE — Progress Notes (Signed)
[name] [address] [place] [address] Phone: [phone] Subjective:   [name], am serving as a scribe for Dr. Antoine Primas.  This visit occurred during the SARS-CoV-2 public health emergency.  Safety protocols were in place, including screening questions prior to the visit, additional usage of staff PPE, and extensive cleaning of exam room while observing appropriate contact time as indicated for disinfecting solutions.   I'm seeing this patient by the request  of:  Patient, No Pcp Per  CC:   [id number]  Stacey Parker is a 49 y.o. female coming in with complaint of back and neck pain. OMT 11/26/2019. Patient states that her hip has improved. Is using Newton shoes and feels these have helped her. Also is being more aware of crossing her legs and trying to not cross her legs.   Pain over medial aspect of right elbow. Did take a lesson last week to work on form. Pain in elbow typically occurs after playing. Has been doing HEP. Just changed raquet from larger to smaller circumference.   Medications patient has been prescribed: Meloxicam more as needed as well as topical anti-inflammatories          Reviewed prior external information including notes and imaging from previsou exam, outside providers and external EMR if available.   As well as notes that were available from care everywhere and other healthcare systems.  Past medical history, social, surgical and family history all reviewed in electronic medical record.  No pertanent information unless stated regarding to the chief complaint.   Past Medical History:  Diagnosis Date  . Hashimoto's thyroiditis   . Polycystic ovary     Allergies  Allergen Reactions  . Sulfa Antibiotics Other (See Comments)     Review of Systems:  No headache, visual changes, nausea, vomiting, diarrhea, constipation, dizziness, abdominal pain, skin rash, fevers, chills, night sweats,  weight loss, swollen lymph nodes, body aches, joint swelling, chest pain, shortness of breath, mood changes. POSITIVE muscle aches  Objective  Blood pressure 120/72, pulse 74, height 5\' 4"  (1.626 m), weight 151 lb (68.5 kg), SpO2 97 %.   General: No apparent distress alert and oriented x3 mood and affect normal, dressed appropriately.  HEENT: Pupils equal, extraocular movements intact  Respiratory: Patient's speak in full sentences and does not appear short of breath  Cardiovascular: No lower extremity edema, non tender, no erythema  Neuro: Cranial nerves II through XII are intact, neurovascularly intact in all extremities with 2+ DTRs and 2+ pulses.  Gait normal with good balance and coordination.  MSK: Right elbow exam does show the patient does have some discomfort with the lateral epicondylar region as well as the medial epicondylar region but no instability of the wrist noted for the elbow.  Mild increase in discomfort though with resisted extension and flexion of the wrist at the elbow   Back -mild tightness in the thoracolumbar junction right greater than left.  Negative straight leg test.  Minimal pain over the greater trochanteric area.  Osteopathic findings   T8 extended rotated and side bent left L1 flexed rotated and side bent right Sacrum right on right       Assessment and Plan:    Nonallopathic problems  Decision today to treat with OMT was based on Physical Exam  After verbal consent patient was treated with HVLA, ME, FPR techniques in cervical, rib, thoracic, lumbar, and sacral  areas  Patient tolerated the procedure well with improvement  in symptoms  Patient given exercises, stretches and lifestyle modifications  See medications in patient instructions if given  Patient will follow up in 4-8 weeks      The above documentation has been reviewed and is accurate and complete Lyndal Pulley, DO       Note: This dictation was prepared with Dragon  dictation along with smaller phrase technology. Any transcriptional errors that result from this process are unintentional.

## 2020-01-16 ENCOUNTER — Encounter: Payer: Self-pay | Admitting: Family Medicine

## 2020-01-20 ENCOUNTER — Other Ambulatory Visit: Payer: Self-pay

## 2020-01-20 DIAGNOSIS — M7711 Lateral epicondylitis, right elbow: Secondary | ICD-10-CM

## 2020-01-23 ENCOUNTER — Ambulatory Visit: Payer: 59 | Attending: Family Medicine | Admitting: Physical Therapy

## 2020-01-23 ENCOUNTER — Encounter: Payer: Self-pay | Admitting: Physical Therapy

## 2020-01-23 ENCOUNTER — Other Ambulatory Visit: Payer: Self-pay

## 2020-01-23 DIAGNOSIS — M25521 Pain in right elbow: Secondary | ICD-10-CM | POA: Diagnosis not present

## 2020-01-23 DIAGNOSIS — M79621 Pain in right upper arm: Secondary | ICD-10-CM | POA: Diagnosis not present

## 2020-01-23 DIAGNOSIS — M542 Cervicalgia: Secondary | ICD-10-CM | POA: Diagnosis not present

## 2020-01-23 DIAGNOSIS — M25531 Pain in right wrist: Secondary | ICD-10-CM | POA: Insufficient documentation

## 2020-01-23 NOTE — Therapy (Signed)
[facility] [address] [place], [address] Phone: [phone]   Fax:  [phone]  Physical Therapy Evaluation  Patient Details  Name: Stacey Parker MRN: [id number] Date of Birth: 11-29-1970 No data recorded  Encounter Date: 01/23/2020   PT End of Session - 01/23/20 1343    Visit Number 1    Number of Visits 17    Date for PT Re-Evaluation 03/20/20    PT Start Time 1115    PT Stop Time 1210    PT Time Calculation (min) 55 min    Activity Tolerance Patient tolerated treatment well    Behavior During Therapy [facility] for tasks assessed/performed           Past Medical History:  Diagnosis Date  . Hashimoto's thyroiditis   . Polycystic ovary     Past Surgical History:  Procedure Laterality Date  . CESAREAN SECTION     x2    There were no vitals filed for this visit.   Subjective Assessment - 01/23/20 1124    Pertinent History Pt is a 49 year old female presenting with R elbow pain that has been worsening over the past year following working at home with poor ergonomics. Patient was playing a lot of tennis last April and had to stop because of elbow pain. From April 2020-present she had stopped playing tennis, and was wearing a wrist immobilizer (24hours/day for 8 weeks, then just at night up until now) which did help with pain. She has been using therabar, and wrist gripper for strengthening. She was "cleared by MD" about a week ago, and returned to playing tennis with lighter rachet, elbow and wirst brace on 2x and had pain during serving and following at lateral elbow. She reports this pain is at distal tricep and lateral elbow (she noticed tricep pain initally) and reports this is a dull ache, with some sharp pain, that is aggravated by push ups/full planks (she completes Barre workouts 5x/week), tennis, typing. Worst pain over past couple weeks 6/10 best 0/10. 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  Pt denies N/V, B&B changes, unexplained weight fluctuation, saddle paresthesia, fever, night sweats, or unrelenting night pain at this time.    Limitations Writing;House hold activities    How long can you sit comfortably? unlimited    How long can you stand comfortably? unlimited    How long can you walk comfortably? unlimited    Diagnostic tests None to date    Currently in Pain? Yes    Pain Score 2     Pain Location Elbow    Pain Orientation Right    Pain Descriptors / Indicators Aching;Dull    Pain Type Chronic pain    Pain Radiating Towards R neck and R wrist    Pain Onset 1 to 4 weeks ago    Pain Frequency Constant    Aggravating Factors  tennis serve, pushup/high planks, typing, writing    Pain Relieving Factors Bracing, ice, stretching, nyroglycerin patch    Effect of Pain on Daily Activities Unable to complete normal workout routine, and job duties            OBJECTIVE  MUSCULOSKELETAL: Tremor: Normal Bulk: Normal Tone: Normal  Cervical Screen AROM: WFL and painless with overpressure in all planes Spurlings Negative bilat  Repeated movement: No centralization or peripheralization with protraction or retraction Mikey Bussing  Sign (cervical cord compression): Negative bilat  ULTT Median: Negative bilat ULTT Radial: Negative bilat  Palpation TTP with concordant sign of pain to ECRB/extensor wad and at lateral epicondyle insertion, and tricep insertion. TTP at wrist flexors and R UT, secondary pain complaint  Strength R/L 5/5 Shoulder flexion (anterior deltoid/pec major/coracobrachialis, axillary n. (C5-6) and musculocutaneous n. (C5-7)) 5/5 Shoulder abduction (deltoid/supraspinatus, axillary/suprascapular n, C5) 5/5 Shoulder external rotation (infraspinatus/teres minor) 5/5 Shoulder internal rotation (subcapularis/lats/pec major) 5/5 Shoulder extension (posterior deltoid,  lats, teres major, axillary/thoracodorsal n.) 5/5 Shoulder horizontal abduction 5/5 Elbow flexion (biceps brachii, brachialis, brachioradialis, musculoskeletal n, C5-6) 5/5 Elbow extension (triceps, radial n, C7) 4+*/5 Wrist Extension 5/5 Wrist Flexion 4*/5 Radial Deviation 5/5 Ulnar Deviation 5/5 Pronation 4*/5 Supination 5/5 Finger adduction (interossei, ulnar n, T1)  AROM R/L All shoulder/elbow/wrist motions WNL *Indicates pain, overpressure performed unless otherwise indicated  PROM R/L PROM = AROM NEUROLOGICAL:  Mental Status Patient is oriented to person, place and time.  Recent memory is intact.  Remote memory is intact.  Attention span and concentration are intact.  Expressive speech is intact.  Patient's fund of knowledge is within normal limits for educational level.  Sensation Grossly intact to light touch bilateral UE as determined by testing dermatomes C2-T2 Proprioception and hot/cold testing deferred on this date  SPECIAL TESTS Lateral Epicondylitis  Cozens: Positive R Mills: Positive R Maudsley: Positive R   Medial Epicondylitis: Passively supinate and ext wrist: Positive R  Valgus stress Negative bilat Varus stress Negative bilat  Bicep Squeeze Test Negative bilat  Cubital Tunnel: Elbow flex test (max elbow flex with wrist ext) Negative bilat Pressure Provocation: Negative bilat Tinels Test Ulnar nerve: Negative bilat  Bicep Tendon Pathology Speed (shoulder flexion to 90, external rotation, full elbow extension, and forearm supination with resistance: Negative bilat Yergason's (resisted shoulder ER and supination/biceps tendon pathology): Negative bilat  Ther-Ex PT reviewed the following HEP with patient with patient able to demonstrate a set of the following with min cuing for correction needed. PT educated patient on parameters of therex (how/when to inc/decrease intensity, frequency, rep/set range, stretch hold time, and purpose of therex)  with verbalized understanding. Patient educated on ceasing painful therex (ie push-ups/plank) for 2 weeks and how to complete these with neutral grip holding weights or modified on wall. Education on RICE regimen, icing 10-78mins 2x/day Access Code: [email or web address] Seated Upper Trapezius Stretch - 7 x weekly - 3x/day - 60sec hold Seated Wrist Flexion Stretch - 7 x weekly - 3x/day - 60sec hold Seated Wrist Ext Stretch - 7 x weekly - 3x/day - 60sec hold        Plan - 01/23/20 1425    Clinical Impression Statement Pt is a 49 year old female presenting with lateral epicondylitis, with additional pain at lateral olecranon process, R UT, and tricep distal insertion. Impaiments in wrist/forearm weakness with pain, decreased endurance, increased soft tissue tension, and postural/ergonomic deficits. 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 Pt will benefit from skilled PT to address impairments to return to PLOF.    Personal Factors and Comorbidities Profession;Time since onset of injury/illness/exacerbation;Past/Current Experience    Examination-Activity Limitations Lift;Carry;Other   writing/typing   Examination-Participation Restrictions Meal Prep;Community Activity   job   Stability/Clinical Decision Making Evolving/Moderate complexity    Clinical Decision Making Moderate    Rehab Potential Good    PT Frequency 2x / week  PT Duration 8 weeks    PT Treatment/Interventions Spinal Manipulations;Joint Manipulations;Taping;Dry needling;Passive range of motion;Manual techniques;Patient/family education;Neuromuscular re-education;Therapeutic activities;DME Instruction;Traction;Ultrasound;Electrical Stimulation;Moist Heat;Functional mobility training;Therapeutic exercise;Cognitive remediation;Iontophoresis 4mg /ml Dexamethasone;Cryotherapy    PT Next Visit Plan TDN;  HEP review    PT Home Exercise Plan therex modification, UT/wrist ext/flex stretch           Patient will benefit from skilled therapeutic intervention in order to improve the following deficits and impairments:  Decreased mobility, Impaired flexibility, Increased muscle spasms, Impaired tone, Postural dysfunction, Decreased activity tolerance, Decreased endurance, Decreased range of motion, Decreased strength, Increased fascial restricitons, Impaired UE functional use, Improper body mechanics, Pain, Decreased coordination  Visit Diagnosis: Pain in right elbow  Pain in right upper arm  Pain in right wrist  Cervicalgia     Problem List Patient Active Problem List   Diagnosis Date Noted  . Low back pain 11/26/2019  . Nonallopathic lesion of lumbosacral region 05/16/2019  . Nonallopathic lesion of sacral region 05/16/2019  . Nonallopathic lesion of thoracic region 05/16/2019  . Lateral epicondylitis of right elbow 04/05/2019  . Greater trochanteric bursitis of right hip 04/05/2019  . Hashimoto's thyroiditis 05/10/2016  . PCOS (polycystic ovarian syndrome) 03/27/2015  . Irregular menses 03/27/2015   03/29/2015 DPT Hilda Lias 01/23/2020, 2:46 PM  [facility] PH[facility] SPORTS MEDICINE [address], [address], [place] Phone: [phone]   Fax:  [phone]  Name: Stacey Parker MRN: Cathleen Fears Date of Birth: [birth year]

## 2020-01-27 ENCOUNTER — Other Ambulatory Visit: Payer: Self-pay

## 2020-01-27 ENCOUNTER — Ambulatory Visit: Payer: 59 | Admitting: Physical Therapy

## 2020-01-27 ENCOUNTER — Encounter: Payer: Self-pay | Admitting: Physical Therapy

## 2020-01-27 DIAGNOSIS — M542 Cervicalgia: Secondary | ICD-10-CM

## 2020-01-27 DIAGNOSIS — M25531 Pain in right wrist: Secondary | ICD-10-CM

## 2020-01-27 DIAGNOSIS — M79621 Pain in right upper arm: Secondary | ICD-10-CM | POA: Diagnosis not present

## 2020-01-27 DIAGNOSIS — M25521 Pain in right elbow: Secondary | ICD-10-CM

## 2020-01-27 NOTE — Therapy (Signed)
[facility] [address], [place], [address] Phone: [phone]   Fax:  [phone]  Physical Therapy Treatment  Patient Details  Name: Stacey Parker MRN: [id number] Date of Birth: May 26, 1971 No data recorded  Encounter Date: 01/27/2020   PT End of Session - 01/27/20 1526    Visit Number 2    Number of Visits 17    Date for PT Re-Evaluation 03/20/20    PT Start Time 0230    PT Stop Time 0315    PT Time Calculation (min) 45 min    Activity Tolerance Patient tolerated treatment well    Behavior During Therapy [facility] for tasks assessed/performed           Past Medical History:  Diagnosis Date  . Hashimoto's thyroiditis   . Polycystic ovary     Past Surgical History:  Procedure Laterality Date  . CESAREAN SECTION     x2    There were no vitals filed for this visit.   Subjective Assessment - 01/27/20 1436    Subjective Patient reports pain is currently at R sided neck and is 5/10. She saw a massage therapist Friday and that she was able to complete modifications from evaluation with success.    Pertinent History Pt is a 49 year old female presenting with R elbow pain that has been worsening over the past year following working at home with poor ergonomics. Patient was playing a lot of tennis last April and had to stop because of elbow pain. From April 2020-present she had stopped playing tennis, and was wearing a wrist immobilizer (24hours/day for 8 weeks, then just at night up until now) which did help with pain. She has been using therabar, and wrist gripper for strengthening. She was "cleared by MD" about a week ago, and returned to playing tennis with lighter rachet, elbow and wirst brace on 2x and had pain during serving and following at lateral elbow. She reports this pain is at distal tricep and lateral elbow (she noticed tricep pain initally) and reports this is a dull ache, with some sharp pain,  that is aggravated by push ups/full planks (she completes Barre workouts 5x/week), tennis, typing. Worst pain over past couple weeks 6/10 best 0/10. 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  Pt denies N/V, B&B changes, unexplained weight fluctuation, saddle paresthesia, fever, night sweats, or unrelenting night pain at this time.    Limitations Writing;House hold activities    How long can you sit comfortably? unlimited    How long can you stand comfortably? unlimited    How long can you walk comfortably? unlimited    Diagnostic tests None to date    Pain Onset 1 to 4 weeks ago          Manual STM with trigger point release to R UT, supinator, wrist extensors, and tricep. Following Dry Needling: (2/2/1/1) 75mm .25 needles placed along the R UT, supinator, and extensor wad with patient in supine; tricep mid muscle belly in prone, in order to decrease increased muscular spasms and trigger points. Patient was educated on risks and benefits of therapy and verbally consents to PT.  Cervical traction 10sec traction 10sec relax x10   Ther-Ex Sup<>pronation 1# DB x20 without pain good carry over of proper technique  Diaphragmatic breathing with hand on chest and belly for TC with good carry over  Y on wall x10; with 2# DB 2x 10 with good carry over of cuing for proper scapular motion Bilat scapular retraction + shoulder ext GTB 3x 10 with min cuing initially to prevent shoulder hiking with good success                          PT Education - 01/27/20 1525    Education Details therex form/technique    Person(s) Educated Patient    Methods Explanation;Demonstration;Verbal cues    Comprehension Verbalized understanding;Returned demonstration;Verbal cues required            PT Short Term Goals - 01/23/20 1344      PT SHORT TERM GOAL #1   Title Pt will be independent with HEP  in order to improve strength and balance in order to decrease fall risk and improve function at home and work.    Baseline 01/23/20 HEP given    Time 4    Period Weeks    Status New             PT Long Term Goals - 01/23/20 1403      PT LONG TERM GOAL #1   Title Patient will increase FOTO score to 65 to demonstrate predicted increase in functional mobility to complete ADLs    Baseline 01/23/20 50    Time 8    Period Weeks    Status New      PT LONG TERM GOAL #2   Title Pt will decrease worst pain as reported on NPRS by at least 3 points in order to demonstrate clinically significant reduction in pain.    Baseline 01/23/20 6/10 lateral elbow and tricep insertion pain    Time 8    Period Weeks    Status New      PT LONG TERM GOAL #3   Title Patient will demonstrate 5/5, pain free wrist/forearm strength in order to demonstrate PLOF symmetrical strength to LUE in order to complete exercise regimen    Baseline 01/23/20 painful R wrist ext 4+; supination 4/5, and radial deviation 4/5    Time 8    Period Weeks    Status New                 Plan - 01/27/20 1532    Clinical Impression Statement PT utilized trigger point dry needling with manual techniques to decrease muscle tension and pain with good success. Patient is able to complete therex progression with proper technique following cuing for technique/motor control. PT will continue progression as able.    Personal Factors and Comorbidities Profession;Time since onset of injury/illness/exacerbation;Past/Current Experience    Examination-Activity Limitations Lift;Carry;Other    Examination-Participation Restrictions Meal Prep;Community Activity    Stability/Clinical Decision Making Evolving/Moderate complexity    Clinical Decision Making Moderate    Rehab Potential Good    PT Frequency 2x / week    PT Duration 8 weeks    PT Treatment/Interventions Spinal Manipulations;Joint Manipulations;Taping;Dry needling;Passive range  of motion;Manual techniques;Patient/family education;Neuromuscular re-education;Therapeutic activities;DME Instruction;Traction;Ultrasound;Electrical Stimulation;Moist Heat;Functional mobility training;Therapeutic exercise;Cognitive remediation;Iontophoresis 4mg /ml Dexamethasone;Cryotherapy    PT Next Visit Plan TDN; HEP review    PT Home Exercise Plan therex modification, UT/wrist ext/flex stretch           Patient will benefit from skilled therapeutic intervention in order to improve the following deficits and impairments:  Decreased mobility, Impaired flexibility, Increased muscle spasms, Impaired tone, Postural dysfunction, Decreased activity tolerance, Decreased endurance, Decreased range of motion,  Decreased strength, Increased fascial restricitons, Impaired UE functional use, Improper body mechanics, Pain, Decreased coordination  Visit Diagnosis: Pain in right elbow  Pain in right upper arm  Pain in right wrist  Cervicalgia     Problem List Patient Active Problem List   Diagnosis Date Noted  . Low back pain 11/26/2019  . Nonallopathic lesion of lumbosacral region 05/16/2019  . Nonallopathic lesion of sacral region 05/16/2019  . Nonallopathic lesion of thoracic region 05/16/2019  . Lateral epicondylitis of right elbow 04/05/2019  . Greater trochanteric bursitis of right hip 04/05/2019  . Hashimoto's thyroiditis 05/10/2016  . PCOS (polycystic ovarian syndrome) 03/27/2015  . Irregular menses 03/27/2015   Stacey Parker DPT Stacey Parker 01/27/2020, 3:37 PM  [facility] PHYSICAL AND SPORTS MEDICINE [address] [place], [address] Phone: [phone]   Fax:  [phone]  Name: Stacey Parker MRN: [id number] Date of Birth: 1970/10/10

## 2020-01-30 ENCOUNTER — Other Ambulatory Visit: Payer: Self-pay

## 2020-01-30 ENCOUNTER — Encounter: Payer: Self-pay | Admitting: Physical Therapy

## 2020-01-30 ENCOUNTER — Ambulatory Visit: Payer: 59 | Attending: Family Medicine | Admitting: Physical Therapy

## 2020-01-30 DIAGNOSIS — M25531 Pain in right wrist: Secondary | ICD-10-CM | POA: Insufficient documentation

## 2020-01-30 DIAGNOSIS — Z13220 Encounter for screening for lipoid disorders: Secondary | ICD-10-CM | POA: Diagnosis not present

## 2020-01-30 DIAGNOSIS — M79621 Pain in right upper arm: Secondary | ICD-10-CM | POA: Insufficient documentation

## 2020-01-30 DIAGNOSIS — E538 Deficiency of other specified B group vitamins: Secondary | ICD-10-CM | POA: Diagnosis not present

## 2020-01-30 DIAGNOSIS — Z0000 Encounter for general adult medical examination without abnormal findings: Secondary | ICD-10-CM | POA: Diagnosis not present

## 2020-01-30 DIAGNOSIS — M25521 Pain in right elbow: Secondary | ICD-10-CM | POA: Insufficient documentation

## 2020-01-30 DIAGNOSIS — Z01419 Encounter for gynecological examination (general) (routine) without abnormal findings: Secondary | ICD-10-CM | POA: Diagnosis not present

## 2020-01-30 DIAGNOSIS — M542 Cervicalgia: Secondary | ICD-10-CM | POA: Diagnosis not present

## 2020-01-30 DIAGNOSIS — Z124 Encounter for screening for malignant neoplasm of cervix: Secondary | ICD-10-CM | POA: Diagnosis not present

## 2020-01-30 DIAGNOSIS — Z1231 Encounter for screening mammogram for malignant neoplasm of breast: Secondary | ICD-10-CM | POA: Diagnosis not present

## 2020-01-30 DIAGNOSIS — E559 Vitamin D deficiency, unspecified: Secondary | ICD-10-CM | POA: Diagnosis not present

## 2020-01-30 DIAGNOSIS — Z1151 Encounter for screening for human papillomavirus (HPV): Secondary | ICD-10-CM | POA: Diagnosis not present

## 2020-01-30 NOTE — Therapy (Signed)
[facility] [address] [place], [address] Phone: [phone]   Fax:  [phone]  Physical Therapy Treatment  Patient Details  Name: Stacey Parker MRN: [id number] Date of Birth: Sep 26, 1970 No data recorded  Encounter Date: 01/30/2020   PT End of Session - 01/30/20 1646    Visit Number 3    Number of Visits 17    Date for PT Re-Evaluation 03/20/20    PT Start Time 0435    PT Stop Time 0515    PT Time Calculation (min) 40 min    Activity Tolerance Patient tolerated treatment well    Behavior During Therapy [facility] for tasks assessed/performed           Past Medical History:  Diagnosis Date  . Hashimoto's thyroiditis   . Polycystic ovary     Past Surgical History:  Procedure Laterality Date  . CESAREAN SECTION     x2    There were no vitals filed for this visit.   Subjective Assessment - 01/30/20 1640    Subjective Reports she did very well following TDN, and that her neck and elbow feel much better. Reports she has been working on posture and her HEP. Reports 0/10 pain today though she still "feels it" on the backside of her upper arm slightly.    Pertinent History Pt is a 49 year old female presenting with R elbow pain that has been worsening over the past year following working at home with poor ergonomics. Patient was playing a lot of tennis last April and had to stop because of elbow pain. From April 2020-present she had stopped playing tennis, and was wearing a wrist immobilizer (24hours/day for 8 weeks, then just at night up until now) which did help with pain. She has been using therabar, and wrist gripper for strengthening. She was "cleared by MD" about a week ago, and returned to playing tennis with lighter rachet, elbow and wirst brace on 2x and had pain during serving and following at lateral elbow. She reports this pain is at distal tricep and lateral elbow (she noticed tricep pain  initally) and reports this is a dull ache, with some sharp pain, that is aggravated by push ups/full planks (she completes Barre workouts 5x/week), tennis, typing. Worst pain over past couple weeks 6/10 best 0/10. 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  Pt denies N/V, B&B changes, unexplained weight fluctuation, saddle paresthesia, fever, night sweats, or unrelenting night pain at this time.    Limitations Writing;House hold activities    How long can you sit comfortably? unlimited    How long can you stand comfortably? unlimited    How long can you walk comfortably? unlimited    Diagnostic tests None to date    Pain Onset 1 to 4 weeks ago          Ther-Ex Supine chin tuck + lift x10 10sec  Quadruped Y on theraball 3x 10 with min cuing for neutral cervical spine with good carry over Bent over row 10# 3x 10 with cuing for set up/posture and scapular retraction with good carry over Unilateral row with trunk rotation 5# 3x 10 with min cuing for eccentric control with good carry over Push up plus on wall 3x 10 with min cuing for scapular motion with good carry over Sup<>pronation 2# DB x20 without pain good carry over of proper  technique                             PT Education - 01/30/20 1646    Education Details therex form, posture    Person(s) Educated Patient    Methods Explanation;Demonstration;Verbal cues    Comprehension Verbalized understanding;Returned demonstration;Verbal cues required            PT Short Term Goals - 01/23/20 1344      PT SHORT TERM GOAL #1   Title Pt will be independent with HEP in order to improve strength and balance in order to decrease fall risk and improve function at home and work.    Baseline 01/23/20 HEP given    Time 4    Period Weeks    Status New             PT Long Term Goals - 01/23/20 1403      PT LONG TERM GOAL #1    Title Patient will increase FOTO score to 65 to demonstrate predicted increase in functional mobility to complete ADLs    Baseline 01/23/20 50    Time 8    Period Weeks    Status New      PT LONG TERM GOAL #2   Title Pt will decrease worst pain as reported on NPRS by at least 3 points in order to demonstrate clinically significant reduction in pain.    Baseline 01/23/20 6/10 lateral elbow and tricep insertion pain    Time 8    Period Weeks    Status New      PT LONG TERM GOAL #3   Title Patient will demonstrate 5/5, pain free wrist/forearm strength in order to demonstrate PLOF symmetrical strength to LUE in order to complete exercise regimen    Baseline 01/23/20 painful R wrist ext 4+; supination 4/5, and radial deviation 4/5    Time 8    Period Weeks    Status New                 Plan - 01/30/20 1654    Clinical Impression Statement PT utlized therex progression for neutral posture with good success. Patient is able to demonstrate carry over of all cuing for proper technique of therex and posture with good motivation throughout session and no increased pain. PT will continue progressiona as able.    Personal Factors and Comorbidities Profession;Time since onset of injury/illness/exacerbation;Past/Current Experience    Examination-Activity Limitations Lift;Carry;Other    Examination-Participation Restrictions Meal Prep;Community Activity    Stability/Clinical Decision Making Evolving/Moderate complexity    Clinical Decision Making Moderate    Rehab Potential Good    PT Frequency 2x / week    PT Duration 8 weeks    PT Treatment/Interventions Spinal Manipulations;Joint Manipulations;Taping;Dry needling;Passive range of motion;Manual techniques;Patient/family education;Neuromuscular re-education;Therapeutic activities;DME Instruction;Traction;Ultrasound;Electrical Stimulation;Moist Heat;Functional mobility training;Therapeutic exercise;Cognitive remediation;Iontophoresis 4mg /ml  Dexamethasone;Cryotherapy    PT Next Visit Plan TDN; HEP review    PT Home Exercise Plan therex modification, UT/wrist ext/flex stretch    Consulted and Agree with Plan of Care Patient           Patient will benefit from skilled therapeutic intervention in order to improve the following deficits and impairments:  Decreased mobility, Impaired flexibility, Increased muscle spasms, Impaired tone, Postural dysfunction, Decreased activity tolerance, Decreased endurance, Decreased range of motion, Decreased strength, Increased fascial restricitons, Impaired UE functional use, Improper body mechanics, Pain, Decreased coordination  Visit Diagnosis: Pain in  right elbow  Pain in right upper arm  Pain in right wrist  Cervicalgia     Problem List Patient Active Problem List   Diagnosis Date Noted  . Low back pain 11/26/2019  . Nonallopathic lesion of lumbosacral region 05/16/2019  . Nonallopathic lesion of sacral region 05/16/2019  . Nonallopathic lesion of thoracic region 05/16/2019  . Lateral epicondylitis of right elbow 04/05/2019  . Greater trochanteric bursitis of right hip 04/05/2019  . Hashimoto's thyroiditis 05/10/2016  . PCOS (polycystic ovarian syndrome) 03/27/2015  . Irregular menses 03/27/2015   Stacey Parker DPT Stacey Parker 01/30/2020, 5:31 PM  [facility] SPORTS [facility]IC[facility] [address] [place], [address] Phone: [phone]   Fax:  [phone]  Name: Stacey Parker MRN: [id number] Date of Birth: [birth year]

## 2020-02-05 ENCOUNTER — Ambulatory Visit: Payer: 59 | Admitting: Physical Therapy

## 2020-02-05 ENCOUNTER — Other Ambulatory Visit: Payer: Self-pay

## 2020-02-05 ENCOUNTER — Encounter: Payer: Self-pay | Admitting: Physical Therapy

## 2020-02-05 DIAGNOSIS — M25521 Pain in right elbow: Secondary | ICD-10-CM | POA: Diagnosis not present

## 2020-02-05 DIAGNOSIS — M542 Cervicalgia: Secondary | ICD-10-CM | POA: Diagnosis not present

## 2020-02-05 DIAGNOSIS — M25531 Pain in right wrist: Secondary | ICD-10-CM | POA: Diagnosis not present

## 2020-02-05 DIAGNOSIS — M79621 Pain in right upper arm: Secondary | ICD-10-CM

## 2020-02-05 NOTE — Therapy (Signed)
[place] [facility] [address], [place], [address] Phone: [phone]   Fax:  [phone]  Physical Therapy Treatment  Patient Details  Name: Stacey Parker MRN: [id number] Date of Birth: [birth year] No data recorded  Encounter Date: 02/05/2020   PT End of Session - 02/05/20 1147    Visit Number 4    Number of Visits 17    Date for PT Re-Evaluation 03/20/20    PT Start Time 1115    PT Stop Time 1155    PT Time Calculation (min) 40 min    Activity Tolerance Patient tolerated treatment well    Behavior During Therapy [facility] for tasks assessed/performed           Past Medical History:  Diagnosis Date  . Hashimoto's thyroiditis   . Polycystic ovary     Past Surgical History:  Procedure Laterality Date  . CESAREAN SECTION     x2    There were no vitals filed for this visit.   Subjective Assessment - 02/05/20 1122    Subjective Patient reports her pain has returned rom last week, in the neck and tricep area. Reports this pain is 5/10 and returned a couple days following PT session, but reports no new activity.    Pertinent History Pt is a 49 year old female presenting with R elbow pain that has been worsening over the past year following working at home with poor ergonomics. Patient was playing a lot of tennis last April and had to stop because of elbow pain. From April 2020-present she had stopped playing tennis, and was wearing a wrist immobilizer (24hours/day for 8 weeks, then just at night up until now) which did help with pain. She has been using therabar, and wrist gripper for strengthening. She was "cleared by MD" about a week ago, and returned to playing tennis with lighter rachet, elbow and wirst brace on 2x and had pain during serving and following at lateral elbow. She reports this pain is at distal tricep and lateral elbow (she noticed tricep pain initally) and reports this is a dull ache, with some  sharp pain, that is aggravated by push ups/full planks (she completes Barre workouts 5x/week), tennis, typing. Worst pain over past couple weeks 6/10 best 0/10. 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  Pt denies N/V, B&B changes, unexplained weight fluctuation, saddle paresthesia, fever, night sweats, or unrelenting night pain at this time.    Limitations Writing;House hold activities    How long can you sit comfortably? unlimited    How long can you stand comfortably? unlimited    How long can you walk comfortably? unlimited    Diagnostic tests None to date    Pain Onset 1 to 4 weeks ago           Manual STM with trigger point release to R UT, levator, latissimus, and tricep. Following Dry Needling: (2/2/2) 95mm .25 needles placed along the R UT, latissimus, and distal tricep muscle belly with patient in prone, and latissimus is supine, in order to decrease increased muscular spasms and trigger points. Patient was educated on risks and benefits of therapy and verbally consents to PT  Ther-Ex Quadruped W on theraball 3x 10 with cuing for set up posture and to prevent thoracic ext with decent carry over High row GTB 3x 10 with cuing initially for proper technique with good  carry over; during this some increased elevation of R scapula with contraction, trigger points noted at R levator Levator stretch 30sec                        PT Education - 02/05/20 1146    Education Details therex form, posture    Person(s) Educated Patient    Methods Explanation;Demonstration;Verbal cues    Comprehension Verbalized understanding;Returned demonstration;Verbal cues required            PT Short Term Goals - 01/23/20 1344      PT SHORT TERM GOAL #1   Title Pt will be independent with HEP in order to improve strength and balance in order to decrease fall risk and improve function at home  and work.    Baseline 01/23/20 HEP given    Time 4    Period Weeks    Status New             PT Long Term Goals - 01/23/20 1403      PT LONG TERM GOAL #1   Title Patient will increase FOTO score to 65 to demonstrate predicted increase in functional mobility to complete ADLs    Baseline 01/23/20 50    Time 8    Period Weeks    Status New      PT LONG TERM GOAL #2   Title Pt will decrease worst pain as reported on NPRS by at least 3 points in order to demonstrate clinically significant reduction in pain.    Baseline 01/23/20 6/10 lateral elbow and tricep insertion pain    Time 8    Period Weeks    Status New      PT LONG TERM GOAL #3   Title Patient will demonstrate 5/5, pain free wrist/forearm strength in order to demonstrate PLOF symmetrical strength to LUE in order to complete exercise regimen    Baseline 01/23/20 painful R wrist ext 4+; supination 4/5, and radial deviation 4/5    Time 8    Period Weeks    Status New                 Plan - 02/05/20 1320    Clinical Impression Statement Patient is cotinuing to have neck and upper arm pain, though pain is centralizing, with no reported forearm or wrist pain this session. Patietn reports decreased pain following manual techniques and TDN, with localized twitch responses and decreased muscle tension allowing for better postural carry over. Patient is able to comply with all cuing for proper technique of therex with good postural carry over of cuing/demonstrations. PT will continue progression as able.    Personal Factors and Comorbidities Profession;Time since onset of injury/illness/exacerbation;Past/Current Experience    Examination-Activity Limitations Lift;Carry;Other    Examination-Participation Restrictions Meal Prep;Community Activity    Stability/Clinical Decision Making Evolving/Moderate complexity    Clinical Decision Making Moderate    Rehab Potential Good    PT Frequency 2x / week    PT Duration 8 weeks    PT  Treatment/Interventions Spinal Manipulations;Joint Manipulations;Taping;Dry needling;Passive range of motion;Manual techniques;Patient/family education;Neuromuscular re-education;Therapeutic activities;DME Instruction;Traction;Ultrasound;Electrical Stimulation;Moist Heat;Functional mobility training;Therapeutic exercise;Cognitive remediation;Iontophoresis 4mg /ml Dexamethasone;Cryotherapy    PT Next Visit Plan TDN; HEP review    PT Home Exercise Plan therex modification, UT/wrist ext/flex stretch    Consulted and Agree with Plan of Care Patient           Patient will benefit from skilled therapeutic intervention in order to improve the  following deficits and impairments:  Decreased mobility, Impaired flexibility, Increased muscle spasms, Impaired tone, Postural dysfunction, Decreased activity tolerance, Decreased endurance, Decreased range of motion, Decreased strength, Increased fascial restricitons, Impaired UE functional use, Improper body mechanics, Pain, Decreased coordination  Visit Diagnosis: Pain in right elbow  Pain in right upper arm  Pain in right wrist  Cervicalgia     Problem List Patient Active Problem List   Diagnosis Date Noted  . Low back pain 11/26/2019  . Nonallopathic lesion of lumbosacral region 05/16/2019  . Nonallopathic lesion of sacral region 05/16/2019  . Nonallopathic lesion of thoracic region 05/16/2019  . Lateral epicondylitis of right elbow 04/05/2019  . Greater trochanteric bursitis of right hip 04/05/2019  . Hashimoto's thyroiditis 05/10/2016  . PCOS (polycystic ovarian syndrome) 03/27/2015  . Irregular menses 03/27/2015   Hilda Lias DPT Hilda Lias 02/05/2020, 1:25 PM  [facility] SPORTS MEDICINE [address] [place], [address] Phone: [phone]   Fax:  [phone]  Name: Stacey Parker MRN: [id number] Date of Birth: 1971/01/25

## 2020-02-07 ENCOUNTER — Encounter: Payer: Self-pay | Admitting: Physical Therapy

## 2020-02-07 ENCOUNTER — Other Ambulatory Visit: Payer: Self-pay

## 2020-02-07 ENCOUNTER — Ambulatory Visit: Payer: 59 | Admitting: Physical Therapy

## 2020-02-07 DIAGNOSIS — M542 Cervicalgia: Secondary | ICD-10-CM | POA: Diagnosis not present

## 2020-02-07 DIAGNOSIS — M25521 Pain in right elbow: Secondary | ICD-10-CM | POA: Diagnosis not present

## 2020-02-07 DIAGNOSIS — M25531 Pain in right wrist: Secondary | ICD-10-CM

## 2020-02-07 DIAGNOSIS — M79621 Pain in right upper arm: Secondary | ICD-10-CM

## 2020-02-07 NOTE — Therapy (Signed)
[facility]ORTS MED[facility]INE [address], [place], [address] Phone: [phone]   Fax:  [phone]  Physical Therapy Treatment  Patient Details  Name: Stacey Parker MRN: [id number] Date of Birth: April 06, 1971 No data recorded  Encounter Date: 02/07/2020   PT End of Session - 02/07/20 1133    Visit Number 5    Number of Visits 17    Date for PT Re-Evaluation 03/20/20    PT Start Time 1056    PT Stop Time 1135    PT Time Calculation (min) 39 min    Activity Tolerance Patient tolerated treatment well    Behavior During Therapy [facility] for tasks assessed/performed           Past Medical History:  Diagnosis Date  . Hashimoto's thyroiditis   . Polycystic ovary     Past Surgical History:  Procedure Laterality Date  . CESAREAN SECTION     x2    There were no vitals filed for this visit.   Subjective Assessment - 02/07/20 1101    Subjective Patient reports she had a massage and cupping done yesterday and feels good overall. Reports she feels like her R shoulder is higher than her L. Reports some soreness at RUE from this but minimal pain. Wrist, elbow and tricep feels much better.    Pertinent History Pt is a 49 year old female presenting with R elbow pain that has been worsening over the past year following working at home with poor ergonomics. Patient was playing a lot of tennis last April and had to stop because of elbow pain. From April 2020-present she had stopped playing tennis, and was wearing a wrist immobilizer (24hours/day for 8 weeks, then just at night up until now) which did help with pain. She has been using therabar, and wrist gripper for strengthening. She was "cleared by MD" about a week ago, and returned to playing tennis with lighter rachet, elbow and wirst brace on 2x and had pain during serving and following at lateral elbow. She reports this pain is at distal tricep and lateral elbow (she noticed tricep  pain initally) and reports this is a dull ache, with some sharp pain, that is aggravated by push ups/full planks (she completes Barre workouts 5x/week), tennis, typing. Worst pain over past couple weeks 6/10 best 0/10. 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  Pt denies N/V, B&B changes, unexplained weight fluctuation, saddle paresthesia, fever, night sweats, or unrelenting night pain at this time.    Pain Onset 1 to 4 weeks ago               Manual STM withtrigger point releaseto R UT, and levator. FollowingDry Needling: (2/2)59mm .25 needles placed along the RUT, and levator muscle belly with patient in prone, with pincer grasp, in orderto decrease increased muscular spasms and trigger points. Patient was educated on risks and benefits of therapy and verbally consents to PT  Ther-Ex Prone Y 3x 10 with min cuing initially to prevent shoulder hiking with good carry over Bent over flys 1# DB x10; 2# 2x 10 with min cuing initially for technique with good carry over Overhead 5# DB farmers carry 3x 20ft with cuing initially for set up with good carry over Scaption GTB 2x 10 with min cuing to maintain tension with good carry over  PT Education - 02/07/20 1123    Education Details therex form, posture    Person(s) Educated Patient    Methods Explanation;Demonstration;Verbal cues    Comprehension Verbalized understanding;Returned demonstration;Verbal cues required            PT Short Term Goals - 01/23/20 1344      PT SHORT TERM GOAL #1   Title Pt will be independent with HEP in order to improve strength and balance in order to decrease fall risk and improve function at home and work.    Baseline 01/23/20 HEP given    Time 4    Period Weeks    Status New             PT Long Term Goals - 01/23/20 1403      PT LONG TERM GOAL #1   Title Patient  will increase FOTO score to 65 to demonstrate predicted increase in functional mobility to complete ADLs    Baseline 01/23/20 50    Time 8    Period Weeks    Status New      PT LONG TERM GOAL #2   Title Pt will decrease worst pain as reported on NPRS by at least 3 points in order to demonstrate clinically significant reduction in pain.    Baseline 01/23/20 6/10 lateral elbow and tricep insertion pain    Time 8    Period Weeks    Status New      PT LONG TERM GOAL #3   Title Patient will demonstrate 5/5, pain free wrist/forearm strength in order to demonstrate PLOF symmetrical strength to LUE in order to complete exercise regimen    Baseline 01/23/20 painful R wrist ext 4+; supination 4/5, and radial deviation 4/5    Time 8    Period Weeks    Status New                 Plan - 02/07/20 1136    Clinical Impression Statement Patient is continuing to report centralization of symptoms with success with manual + TDN techniques. PT continued therex progression for postural control with good carry over of all cuing/demo for proper technique/muscle activation. PT will continue progression as able.    Personal Factors and Comorbidities Profession;Time since onset of injury/illness/exacerbation;Past/Current Experience    Examination-Activity Limitations Lift;Carry;Other    Examination-Participation Restrictions Meal Prep;Community Activity    Stability/Clinical Decision Making Evolving/Moderate complexity    Clinical Decision Making Moderate    Rehab Potential Good    PT Frequency 2x / week    PT Treatment/Interventions Spinal Manipulations;Joint Manipulations;Taping;Dry needling;Passive range of motion;Manual techniques;Patient/family education;Neuromuscular re-education;Therapeutic activities;DME Instruction;Traction;Ultrasound;Electrical Stimulation;Moist Heat;Functional mobility training;Therapeutic exercise;Cognitive remediation;Iontophoresis 4mg /ml Dexamethasone;Cryotherapy    PT Next  Visit Plan TDN; HEP review    PT Home Exercise Plan therex modification, UT/wrist ext/flex stretch    Consulted and Agree with Plan of Care Patient           Patient will benefit from skilled therapeutic intervention in order to improve the following deficits and impairments:  Decreased mobility, Impaired flexibility, Increased muscle spasms, Impaired tone, Postural dysfunction, Decreased activity tolerance, Decreased endurance, Decreased range of motion, Decreased strength, Increased fascial restricitons, Impaired UE functional use, Improper body mechanics, Pain, Decreased coordination  Visit Diagnosis: Pain in right elbow  Pain in right upper arm  Cervicalgia  Pain in right wrist     Problem List Patient Active Problem List   Diagnosis Date Noted  . Low back pain 11/26/2019  . Nonallopathic  lesion of lumbosacral region 05/16/2019  . Nonallopathic lesion of sacral region 05/16/2019  . Nonallopathic lesion of thoracic region 05/16/2019  . Lateral epicondylitis of right elbow 04/05/2019  . Greater trochanteric bursitis of right hip 04/05/2019  . Hashimoto's thyroiditis 05/10/2016  . PCOS (polycystic ovarian syndrome) 03/27/2015  . Irregular menses 03/27/2015   Hilda Lias DPT Hilda Lias 02/07/2020, 11:40 AM  [facility] PHYSICAL AND SPORTS MEDICINE [address] [place], [address] Phone: [phone]   Fax:  [phone]  Name: Stacey Parker MRN: [id number] Date of Birth: [birth year]

## 2020-02-10 ENCOUNTER — Ambulatory Visit: Payer: 59 | Admitting: Physical Therapy

## 2020-02-12 ENCOUNTER — Other Ambulatory Visit: Payer: Self-pay

## 2020-02-12 ENCOUNTER — Encounter: Payer: Self-pay | Admitting: Physical Therapy

## 2020-02-12 ENCOUNTER — Ambulatory Visit: Payer: 59 | Admitting: Physical Therapy

## 2020-02-12 DIAGNOSIS — M25521 Pain in right elbow: Secondary | ICD-10-CM

## 2020-02-12 DIAGNOSIS — M542 Cervicalgia: Secondary | ICD-10-CM | POA: Diagnosis not present

## 2020-02-12 DIAGNOSIS — M79621 Pain in right upper arm: Secondary | ICD-10-CM

## 2020-02-12 DIAGNOSIS — M25531 Pain in right wrist: Secondary | ICD-10-CM | POA: Diagnosis not present

## 2020-02-12 NOTE — Therapy (Signed)
[place] [facility] [address], [place], [address] Phone: [phone]   Fax:  [phone]  Physical Therapy Treatment  Patient Details  Name: Stacey Parker MRN: [id number] Date of Birth: [birth year] No data recorded  Encounter Date: 02/12/2020   PT End of Session - 02/12/20 1147    Visit Number 6    Number of Visits 17    Date for PT Re-Evaluation 03/20/20    PT Start Time 1126    PT Stop Time 1204    PT Time Calculation (min) 38 min    Activity Tolerance Patient tolerated treatment well    Behavior During Therapy [facility] for tasks assessed/performed           Past Medical History:  Diagnosis Date  . Hashimoto's thyroiditis   . Polycystic ovary     Past Surgical History:  Procedure Laterality Date  . CESAREAN SECTION     x2    There were no vitals filed for this visit.   Subjective Assessment - 02/12/20 1131    Subjective Patinet reports she is feeling better overall. She was a little sore following TDN, but feels good overall. Reports no pain today.    Pertinent History Pt is a 49 year old female presenting with R elbow pain that has been worsening over the past year following working at home with poor ergonomics. Patient was playing a lot of tennis last April and had to stop because of elbow pain. From April 2020-present she had stopped playing tennis, and was wearing a wrist immobilizer (24hours/day for 8 weeks, then just at night up until now) which did help with pain. She has been using therabar, and wrist gripper for strengthening. She was "cleared by MD" about a week ago, and returned to playing tennis with lighter rachet, elbow and wirst brace on 2x and had pain during serving and following at lateral elbow. She reports this pain is at distal tricep and lateral elbow (she noticed tricep pain initally) and reports this is a dull ache, with some sharp pain, that is aggravated by push ups/full planks  (she completes Barre workouts 5x/week), tennis, typing. Worst pain over past couple weeks 6/10 best 0/10. 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  Pt denies N/V, B&B changes, unexplained weight fluctuation, saddle paresthesia, fever, night sweats, or unrelenting night pain at this time.    Limitations Writing;House hold activities    How long can you sit comfortably? unlimited    How long can you stand comfortably? unlimited    How long can you walk comfortably? unlimited    Diagnostic tests None to date           Manual STM withtrigger point releaseto R UT,and levator. FollowingDry Needling: (2/2)70mm .25 needles placed along the RUT, and levator muscle bellywith patient in prone, with pincer grasp, in orderto decrease increased muscular spasms and trigger points. Patient was educated on risks and benefits of therapy and verbally consents to PT  Ther-Ex Prone Y x10 with 1# DB 2x 10 with min for scapular retraction with good carry over Standing ER in 90/90 5# 3x 8 with demo and cuing initially with good carry over following Bent over row 10# x10; 15# 2x 10 with min cuing for posture set up with good carry over following Bent over flys 2# 2x 10 with min cuing to maintain scapular retraction with  eccentric control                             PT Education - 02/12/20 1146    Education Details therex form, posture    Person(s) Educated Patient    Methods Explanation;Demonstration;Verbal cues    Comprehension Verbalized understanding;Returned demonstration;Verbal cues required            PT Short Term Goals - 01/23/20 1344      PT SHORT TERM GOAL #1   Title Pt will be independent with HEP in order to improve strength and balance in order to decrease fall risk and improve function at home and work.    Baseline 01/23/20 HEP given    Time 4    Period Weeks     Status New             PT Long Term Goals - 01/23/20 1403      PT LONG TERM GOAL #1   Title Patient will increase FOTO score to 65 to demonstrate predicted increase in functional mobility to complete ADLs    Baseline 01/23/20 50    Time 8    Period Weeks    Status New      PT LONG TERM GOAL #2   Title Pt will decrease worst pain as reported on NPRS by at least 3 points in order to demonstrate clinically significant reduction in pain.    Baseline 01/23/20 6/10 lateral elbow and tricep insertion pain    Time 8    Period Weeks    Status New      PT LONG TERM GOAL #3   Title Patient will demonstrate 5/5, pain free wrist/forearm strength in order to demonstrate PLOF symmetrical strength to LUE in order to complete exercise regimen    Baseline 01/23/20 painful R wrist ext 4+; supination 4/5, and radial deviation 4/5    Time 8    Period Weeks    Status New                 Plan - 02/12/20 1159    Clinical Impression Statement Patient continues to respond well to manual techniques with local twitch response to TDN, and decreased soft tissue tension following. Pt continues to improve posture through therex, with good carry over of all cuing/demo for proper techique and no increased pain with persicapular activation. PTwill continue progression as able.    Personal Factors and Comorbidities Profession;Time since onset of injury/illness/exacerbation;Past/Current Experience    Examination-Activity Limitations Lift;Carry;Other    Examination-Participation Restrictions Meal Prep;Community Activity    Stability/Clinical Decision Making Evolving/Moderate complexity    Clinical Decision Making Moderate    Rehab Potential Good    PT Frequency 2x / week    PT Duration 8 weeks    PT Treatment/Interventions Spinal Manipulations;Joint Manipulations;Taping;Dry needling;Passive range of motion;Manual techniques;Patient/family education;Neuromuscular re-education;Therapeutic activities;DME  Instruction;Traction;Ultrasound;Electrical Stimulation;Moist Heat;Functional mobility training;Therapeutic exercise;Cognitive remediation;Iontophoresis 4mg /ml Dexamethasone;Cryotherapy    PT Next Visit Plan TDN; HEP review    PT Home Exercise Plan therex modification, UT/wrist ext/flex stretch    Consulted and Agree with Plan of Care Patient           Patient will benefit from skilled therapeutic intervention in order to improve the following deficits and impairments:  Decreased mobility, Impaired flexibility, Increased muscle spasms, Impaired tone, Postural dysfunction, Decreased activity tolerance, Decreased endurance, Decreased range of motion, Decreased strength, Increased fascial restricitons, Impaired UE functional use, Improper body mechanics,  Pain, Decreased coordination  Visit Diagnosis: Pain in right elbow  Pain in right upper arm     Problem List Patient Active Problem List   Diagnosis Date Noted  . Low back pain 11/26/2019  . Nonallopathic lesion of lumbosacral region 05/16/2019  . Nonallopathic lesion of sacral region 05/16/2019  . Nonallopathic lesion of thoracic region 05/16/2019  . Lateral epicondylitis of right elbow 04/05/2019  . Greater trochanteric bursitis of right hip 04/05/2019  . Hashimoto's thyroiditis 05/10/2016  . PCOS (polycystic ovarian syndrome) 03/27/2015  . Irregular menses 03/27/2015   Hilda Lias DPT Hilda Lias 02/12/2020, 12:15 PM  [place] [facility] PHYSICAL AND SPORTS MEDICINE [address] [place], [address] Phone: [phone]   Fax:  [phone]  Name: Stacey Parker MRN: [id number] Date of Birth: 13-Jan-1971

## 2020-02-18 ENCOUNTER — Encounter: Payer: Self-pay | Admitting: Physical Therapy

## 2020-02-18 ENCOUNTER — Ambulatory Visit: Payer: 59 | Admitting: Physical Therapy

## 2020-02-18 ENCOUNTER — Other Ambulatory Visit: Payer: Self-pay

## 2020-02-18 DIAGNOSIS — M79621 Pain in right upper arm: Secondary | ICD-10-CM

## 2020-02-18 DIAGNOSIS — M542 Cervicalgia: Secondary | ICD-10-CM

## 2020-02-18 DIAGNOSIS — M25521 Pain in right elbow: Secondary | ICD-10-CM | POA: Diagnosis not present

## 2020-02-18 DIAGNOSIS — M25531 Pain in right wrist: Secondary | ICD-10-CM | POA: Diagnosis not present

## 2020-02-18 NOTE — Therapy (Signed)
[place] [facility] [address], [place], [address] Phone: [phone]   Fax:  [phone]  Physical Therapy Treatment  Patient Details  Name: Stacey Parker MRN: [id number] Date of Birth: Jun 04, 1971 No data recorded  Encounter Date: 02/18/2020   PT End of Session - 02/18/20 1514    Visit Number 7    Number of Visits 17    Date for PT Re-Evaluation 03/20/20    PT Start Time 0247    PT Stop Time 0326    PT Time Calculation (min) 39 min    Activity Tolerance Patient tolerated treatment well    Behavior During Therapy [facility] for tasks assessed/performed           Past Medical History:  Diagnosis Date  . Hashimoto's thyroiditis   . Polycystic ovary     Past Surgical History:  Procedure Laterality Date  . CESAREAN SECTION     x2    There were no vitals filed for this visit.   Subjective Assessment - 02/18/20 1447    Subjective Patient says she is "a mess" today, but was feeling really good until last night. She reports she started stretching (completing UT stretch and reach behind back pec stretch) and her pec and UT started spasming and is still bothering her today. Reports elbow and wrist still feel pretty good. She would like to attempt to start adding pushing and quadraped positions back into work outs.    Pertinent History Pt is a 49 year old female presenting with R elbow pain that has been worsening over the past year following working at home with poor ergonomics. Patient was playing a lot of tennis last April and had to stop because of elbow pain. From April 2020-present she had stopped playing tennis, and was wearing a wrist immobilizer (24hours/day for 8 weeks, then just at night up until now) which did help with pain. She has been using therabar, and wrist gripper for strengthening. She was "cleared by MD" about a week ago, and returned to playing tennis with lighter rachet, elbow and wirst brace on  2x and had pain during serving and following at lateral elbow. She reports this pain is at distal tricep and lateral elbow (she noticed tricep pain initally) and reports this is a dull ache, with some sharp pain, that is aggravated by push ups/full planks (she completes Barre workouts 5x/week), tennis, typing. Worst pain over past couple weeks 6/10 best 0/10. 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  Pt denies N/V, B&B changes, unexplained weight fluctuation, saddle paresthesia, fever, night sweats, or unrelenting night pain at this time.    Limitations Writing;House hold activities    How long can you sit comfortably? unlimited    How long can you stand comfortably? unlimited    How long can you walk comfortably? unlimited    Diagnostic tests None to date    Pain Onset 1 to 4 weeks ago              Manual STM withtrigger point releaseto R UT, pev minorandlevator. FollowingDry Needling: (2/2/2)94mm .25 needles placed along the RUT, pec minor andlevatormuscle bellywith patient in supine/prone,with pincer grasp, in orderto decrease increased muscular spasms and trigger points. Patient was educated on risks and benefits of therapy and verbally consents to PT  Ther-Ex Seated chest press 25# 3x 10 with min cuing initially to  prevent excessive protraction with good carry over Single UE cross hook 5# 3x 10/10/8 with increased speed with concentric, slow eccentric with good carry over Qped alt Y 2x 10 with hands on 5# DB for srist neutral, TC for set up with good carry over Crunch modification with hands moving toward feet with good demonstrated carry over. Demo of Leg lower variations and plank for core exercise with good carry over         PT Education - 02/18/20 1514    Education Details therex form, posture    Person(s) Educated Patient    Methods Explanation;Demonstration;Verbal  cues;Tactile cues    Comprehension Verbalized understanding;Returned demonstration;Verbal cues required;Tactile cues required            PT Short Term Goals - 01/23/20 1344      PT SHORT TERM GOAL #1   Title Pt will be independent with HEP in order to improve strength and balance in order to decrease fall risk and improve function at home and work.    Baseline 01/23/20 HEP given    Time 4    Period Weeks    Status New             PT Long Term Goals - 01/23/20 1403      PT LONG TERM GOAL #1   Title Patient will increase FOTO score to 65 to demonstrate predicted increase in functional mobility to complete ADLs    Baseline 01/23/20 50    Time 8    Period Weeks    Status New      PT LONG TERM GOAL #2   Title Pt will decrease worst pain as reported on NPRS by at least 3 points in order to demonstrate clinically significant reduction in pain.    Baseline 01/23/20 6/10 lateral elbow and tricep insertion pain    Time 8    Period Weeks    Status New      PT LONG TERM GOAL #3   Title Patient will demonstrate 5/5, pain free wrist/forearm strength in order to demonstrate PLOF symmetrical strength to LUE in order to complete exercise regimen    Baseline 01/23/20 painful R wrist ext 4+; supination 4/5, and radial deviation 4/5    Time 8    Period Weeks    Status New                 Plan - 02/18/20 1529    Clinical Impression Statement PT continued to utilize manual with TDN to decrease muscle tension with good success, and localized twitch response. PT continued therex progression with progression toward pushing, closed chain, and sport specific strengthening with no increased pain and good technique following cuing. T reviewed effective core exercises for home without postural aggravation with good understanding from patient. PT will continue progression as able.    Personal Factors and Comorbidities Profession;Time since onset of injury/illness/exacerbation;Past/Current  Experience    Examination-Activity Limitations Lift;Carry;Other    Examination-Participation Restrictions Meal Prep;Community Activity    Stability/Clinical Decision Making Evolving/Moderate complexity    Clinical Decision Making Moderate    Rehab Potential Good    PT Frequency 2x / week    PT Duration 8 weeks    PT Treatment/Interventions Spinal Manipulations;Joint Manipulations;Taping;Dry needling;Passive range of motion;Manual techniques;Patient/family education;Neuromuscular re-education;Therapeutic activities;DME Instruction;Traction;Ultrasound;Electrical Stimulation;Moist Heat;Functional mobility training;Therapeutic exercise;Cognitive remediation;Iontophoresis 4mg /ml Dexamethasone;Cryotherapy    PT Next Visit Plan TDN; HEP review    PT Home Exercise Plan therex modification, UT/wrist ext/flex stretch    Consulted  and Agree with Plan of Care Patient           Patient will benefit from skilled therapeutic intervention in order to improve the following deficits and impairments:  Decreased mobility, Impaired flexibility, Increased muscle spasms, Impaired tone, Postural dysfunction, Decreased activity tolerance, Decreased endurance, Decreased range of motion, Decreased strength, Increased fascial restricitons, Impaired UE functional use, Improper body mechanics, Pain, Decreased coordination  Visit Diagnosis: Pain in right elbow  Pain in right upper arm  Cervicalgia  Pain in right wrist     Problem List Patient Active Problem List   Diagnosis Date Noted  . Low back pain 11/26/2019  . Nonallopathic lesion of lumbosacral region 05/16/2019  . Nonallopathic lesion of sacral region 05/16/2019  . Nonallopathic lesion of thoracic region 05/16/2019  . Lateral epicondylitis of right elbow 04/05/2019  . Greater trochanteric bursitis of right hip 04/05/2019  . Hashimoto's thyroiditis 05/10/2016  . PCOS (polycystic ovarian syndrome) 03/27/2015  . Irregular menses 03/27/2015   Hilda Lias DPT Hilda Lias 02/18/2020, 3:32 PM  [facility] PHYSICAL AND SPORTS MEDICINE [address] [place], [address] Phone: [phone]   Fax:  [phone]  Name: Stacey Parker MRN: [id number] Date of Birth: 03/25/1971

## 2020-02-20 ENCOUNTER — Ambulatory Visit: Payer: 59 | Admitting: Physical Therapy

## 2020-02-20 ENCOUNTER — Other Ambulatory Visit: Payer: Self-pay

## 2020-02-20 ENCOUNTER — Encounter: Payer: Self-pay | Admitting: Physical Therapy

## 2020-02-20 DIAGNOSIS — M79621 Pain in right upper arm: Secondary | ICD-10-CM

## 2020-02-20 DIAGNOSIS — M25521 Pain in right elbow: Secondary | ICD-10-CM

## 2020-02-20 DIAGNOSIS — M25531 Pain in right wrist: Secondary | ICD-10-CM | POA: Diagnosis not present

## 2020-02-20 DIAGNOSIS — M542 Cervicalgia: Secondary | ICD-10-CM | POA: Diagnosis not present

## 2020-02-20 NOTE — Therapy (Signed)
[place] [facility] [address] [place], [address] Phone: [phone]   Fax:  [phone]  Physical Therapy Treatment  Patient Details  Name: Stacey Parker MRN: [id number] Date of Birth: [birth year] No data recorded  Encounter Date: 02/20/2020   PT End of Session - 02/20/20 1807    Visit Number 8    Number of Visits 17    Date for PT Re-Evaluation 03/20/20    PT Start Time 0537    PT Stop Time 0618    PT Time Calculation (min) 41 min    Activity Tolerance Patient tolerated treatment well    Behavior During Therapy [facility] for tasks assessed/performed           Past Medical History:  Diagnosis Date  . Hashimoto's thyroiditis   . Polycystic ovary     Past Surgical History:  Procedure Laterality Date  . CESAREAN SECTION     x2    There were no vitals filed for this visit.   Subjective Assessment - 02/20/20 1740    Subjective Patient reports that she feels better than last appt. Reports less than 5/10 pain in the UT and levator area.    Pertinent History Pt is a 48 year old female presenting with R elbow pain that has been worsening over the past year following working at home with poor ergonomics. Patient was playing a lot of tennis last April and had to stop because of elbow pain. From April 2020-present she had stopped playing tennis, and was wearing a wrist immobilizer (24hours/day for 8 weeks, then just at night up until now) which did help with pain. She has been using therabar, and wrist gripper for strengthening. She was "cleared by MD" about a week ago, and returned to playing tennis with lighter rachet, elbow and wirst brace on 2x and had pain during serving and following at lateral elbow. She reports this pain is at distal tricep and lateral elbow (she noticed tricep pain initally) and reports this is a dull ache, with some sharp pain, that is aggravated by push ups/full planks (she completes Barre  workouts 5x/week), tennis, typing. Worst pain over past couple weeks 6/10 best 0/10. 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  Pt denies N/V, B&B changes, unexplained weight fluctuation, saddle paresthesia, fever, night sweats, or unrelenting night pain at this time.    Limitations Writing;House hold activities    How long can you sit comfortably? unlimited    How long can you stand comfortably? unlimited    How long can you walk comfortably? unlimited    Diagnostic tests None to date    Pain Onset 1 to 4 weeks ago            Manual STM withtrigger point releaseto R UT, pec minorandlevator. FollowingDry Needling: (2/2/)80mm .25 needles placed along the RUT, tricep andlevatormuscle bellywith patient in supine/prone,with pincer grasp, in orderto decrease increased muscular spasms and trigger points. Patient was educated on risks and benefits of therapy and verbally consents to PT  Ther-Ex Prone I x10; 2# 2x 10  with cuing for scapular depression with good carry over Modified push up on wall 3x 8 with good carry over of proper technique following demo and min cuing Seated chest press 25# 3x 10 with min cuing initially to prevent excessive protraction with good carry over Tricep ext 15# 3x 10 with min  cuing for set up technique with good carry over following                       PT Education - 02/20/20 1757    Education Details therex form, posture    Person(s) Educated Patient    Methods Explanation;Demonstration;Verbal cues    Comprehension Verbalized understanding;Returned demonstration;Verbal cues required            PT Short Term Goals - 01/23/20 1344      PT SHORT TERM GOAL #1   Title Pt will be independent with HEP in order to improve strength and balance in order to decrease fall risk and improve function at home and work.    Baseline 01/23/20 HEP  given    Time 4    Period Weeks    Status New             PT Long Term Goals - 01/23/20 1403      PT LONG TERM GOAL #1   Title Patient will increase FOTO score to 65 to demonstrate predicted increase in functional mobility to complete ADLs    Baseline 01/23/20 50    Time 8    Period Weeks    Status New      PT LONG TERM GOAL #2   Title Pt will decrease worst pain as reported on NPRS by at least 3 points in order to demonstrate clinically significant reduction in pain.    Baseline 01/23/20 6/10 lateral elbow and tricep insertion pain    Time 8    Period Weeks    Status New      PT LONG TERM GOAL #3   Title Patient will demonstrate 5/5, pain free wrist/forearm strength in order to demonstrate PLOF symmetrical strength to LUE in order to complete exercise regimen    Baseline 01/23/20 painful R wrist ext 4+; supination 4/5, and radial deviation 4/5    Time 8    Period Weeks    Status New                 Plan - 02/20/20 1824    Clinical Impression Statement PT continued to utlize manual techniques with TDN with good success. PT continued therex progression for increased strength and motor control with success. PT continued to increased closed chain "pushing" therex with no increased pain and good technique. PT will continue progression as able.    Personal Factors and Comorbidities Profession;Time since onset of injury/illness/exacerbation;Past/Current Experience    Examination-Activity Limitations Lift;Carry;Other    Examination-Participation Restrictions Meal Prep;Community Activity    Stability/Clinical Decision Making Evolving/Moderate complexity    Clinical Decision Making Moderate    Rehab Potential Good    PT Frequency 2x / week    PT Duration 8 weeks    PT Treatment/Interventions Spinal Manipulations;Joint Manipulations;Taping;Dry needling;Passive range of motion;Manual techniques;Patient/family education;Neuromuscular re-education;Therapeutic activities;DME  Instruction;Traction;Ultrasound;Electrical Stimulation;Moist Heat;Functional mobility training;Therapeutic exercise;Cognitive remediation;Iontophoresis 4mg /ml Dexamethasone;Cryotherapy    PT Next Visit Plan TDN; HEP review    PT Home Exercise Plan therex modification, UT/wrist ext/flex stretch    Consulted and Agree with Plan of Care Patient           Patient will benefit from skilled therapeutic intervention in order to improve the following deficits and impairments:  Decreased mobility, Impaired flexibility, Increased muscle spasms, Impaired tone, Postural dysfunction, Decreased activity tolerance, Decreased endurance, Decreased range of motion, Decreased strength, Increased fascial restricitons, Impaired UE functional use, Improper body mechanics, Pain, Decreased coordination  Visit Diagnosis: Pain in right elbow  Pain in right upper arm  Cervicalgia  Pain in right wrist     Problem List Patient Active Problem List   Diagnosis Date Noted  . Low back pain 11/26/2019  . Nonallopathic lesion of lumbosacral region 05/16/2019  . Nonallopathic lesion of sacral region 05/16/2019  . Nonallopathic lesion of thoracic region 05/16/2019  . Lateral epicondylitis of right elbow 04/05/2019  . Greater trochanteric bursitis of right hip 04/05/2019  . Hashimoto's thyroiditis 05/10/2016  . PCOS (polycystic ovarian syndrome) 03/27/2015  . Irregular menses 03/27/2015   Hilda Lias DPT Hilda Lias 02/20/2020, 6:36 PM  [place] [facility] PHYSICAL AND SPORTS MEDICINE [address], [place], [address] Phone: [phone]   Fax:  [phone]  Name: ORIT SANVILLE MRN: [id number] Date of Birth: [birth year]

## 2020-02-27 ENCOUNTER — Encounter: Payer: 59 | Admitting: Physical Therapy

## 2020-03-03 ENCOUNTER — Other Ambulatory Visit: Payer: Self-pay

## 2020-03-03 ENCOUNTER — Encounter: Payer: Self-pay | Admitting: Physical Therapy

## 2020-03-03 ENCOUNTER — Ambulatory Visit: Payer: 59 | Attending: Family Medicine | Admitting: Physical Therapy

## 2020-03-03 DIAGNOSIS — M25521 Pain in right elbow: Secondary | ICD-10-CM | POA: Diagnosis not present

## 2020-03-03 DIAGNOSIS — M79621 Pain in right upper arm: Secondary | ICD-10-CM | POA: Insufficient documentation

## 2020-03-03 DIAGNOSIS — M542 Cervicalgia: Secondary | ICD-10-CM | POA: Insufficient documentation

## 2020-03-03 DIAGNOSIS — M25531 Pain in right wrist: Secondary | ICD-10-CM | POA: Insufficient documentation

## 2020-03-03 NOTE — Therapy (Signed)
[facility] [address] [place], [address] Phone: [phone]   Fax:  [phone]  Physical Therapy Treatment  Patient Details  Name: Stacey Parker MRN: [id number] Date of Birth: 06-24-1971 No data recorded  Encounter Date: 03/03/2020   PT End of Session - 03/03/20 1607    Visit Number 9    Number of Visits 17    Date for PT Re-Evaluation 03/20/20    PT Start Time 0240    PT Stop Time 0319    PT Time Calculation (min) 39 min    Activity Tolerance Patient tolerated treatment well    Behavior During Therapy [facility] for tasks assessed/performed           Past Medical History:  Diagnosis Date  . Hashimoto's thyroiditis   . Polycystic ovary     Past Surgical History:  Procedure Laterality Date  . CESAREAN SECTION     x2    There were no vitals filed for this visit.   Subjective Assessment - 03/03/20 1442    Subjective Patient reports she is still feeling really good overall, no pain today. Has been completing pushing therex with modified push up position.    Pertinent History Pt is a 49 year old female presenting with R elbow pain that has been worsening over the past year following working at home with poor ergonomics. Patient was playing a lot of tennis last April and had to stop because of elbow pain. From April 2020-present she had stopped playing tennis, and was wearing a wrist immobilizer (24hours/day for 8 weeks, then just at night up until now) which did help with pain. She has been using therabar, and wrist gripper for strengthening. She was "cleared by MD" about a week ago, and returned to playing tennis with lighter rachet, elbow and wirst brace on 2x and had pain during serving and following at lateral elbow. She reports this pain is at distal tricep and lateral elbow (she noticed tricep pain initally) and reports this is a dull ache, with some sharp pain, that is aggravated by push ups/full  planks (she completes Barre workouts 5x/week), tennis, typing. Worst pain over past couple weeks 6/10 best 0/10. 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  Pt denies N/V, B&B changes, unexplained weight fluctuation, saddle paresthesia, fever, night sweats, or unrelenting night pain at this time.    Limitations Writing;House hold activities    How long can you sit comfortably? unlimited    How long can you stand comfortably? unlimited    How long can you walk comfortably? unlimited    Diagnostic tests None to date    Pain Onset 1 to 4 weeks ago           Therapeutic Activity  D1 flexion BluTB 2x 15 with cuing for hip and core drive with good carry over; with speed x6 with focus on speed with flex with eccentric control  Racket swing forehand and backhand x20 each direction with slo-mo video revealing excessive elbow and wrist ext, which patient is able to somewhat correct with cuing following visual demo Education on "unlocking" elbow and "strong" position with good understanding; able to carry over decent form Forehand and backhand with racket x15 with good carry over of all cuing from previous cues.  Education on proper swing with good understanding to carry forth to trainer  PT Education - 03/03/20 1458    Education Details therex form/technique, tennis swing    Person(s) Educated Patient    Methods Explanation;Demonstration;Verbal cues    Comprehension Verbalized understanding;Returned demonstration;Verbal cues required            PT Short Term Goals - 01/23/20 1344      PT SHORT TERM GOAL #1   Title Pt will be independent with HEP in order to improve strength and balance in order to decrease fall risk and improve function at home and work.    Baseline 01/23/20 HEP given    Time 4    Period Weeks    Status New             PT  Long Term Goals - 01/23/20 1403      PT LONG TERM GOAL #1   Title Patient will increase FOTO score to 65 to demonstrate predicted increase in functional mobility to complete ADLs    Baseline 01/23/20 50    Time 8    Period Weeks    Status New      PT LONG TERM GOAL #2   Title Pt will decrease worst pain as reported on NPRS by at least 3 points in order to demonstrate clinically significant reduction in pain.    Baseline 01/23/20 6/10 lateral elbow and tricep insertion pain    Time 8    Period Weeks    Status New      PT LONG TERM GOAL #3   Title Patient will demonstrate 5/5, pain free wrist/forearm strength in order to demonstrate PLOF symmetrical strength to LUE in order to complete exercise regimen    Baseline 01/23/20 painful R wrist ext 4+; supination 4/5, and radial deviation 4/5    Time 8    Period Weeks    Status New                 Plan - 03/03/20 1735    Clinical Impression Statement PT carried forth therex and motor control to tennis swing, as this is aggravating. PT assessed swing with heavy education on reducing elbow hyperext and creating power through core/hips with good understanding from patient and decent carry over to demonstration. PT encouraged graded progression of tennis, beginning with controlled environment with good understanding. PT will continue progression as able.    Personal Factors and Comorbidities Profession;Time since onset of injury/illness/exacerbation;Past/Current Experience    Examination-Activity Limitations Lift;Carry;Other    Examination-Participation Restrictions Meal Prep;Community Activity    Stability/Clinical Decision Making Evolving/Moderate complexity    Clinical Decision Making Moderate    Rehab Potential Good    PT Frequency 2x / week    PT Duration 8 weeks    PT Treatment/Interventions Spinal Manipulations;Joint Manipulations;Taping;Dry needling;Passive range of motion;Manual techniques;Patient/family education;Neuromuscular  re-education;Therapeutic activities;DME Instruction;Traction;Ultrasound;Electrical Stimulation;Moist Heat;Functional mobility training;Therapeutic exercise;Cognitive remediation;Iontophoresis 4mg /ml Dexamethasone;Cryotherapy    PT Next Visit Plan TDN; HEP review    PT Home Exercise Plan therex modification, UT/wrist ext/flex stretch    Consulted and Agree with Plan of Care Patient           Patient will benefit from skilled therapeutic intervention in order to improve the following deficits and impairments:  Decreased mobility, Impaired flexibility, Increased muscle spasms, Impaired tone, Postural dysfunction, Decreased activity tolerance, Decreased endurance, Decreased range of motion, Decreased strength, Increased fascial restricitons, Impaired UE functional use, Improper body mechanics, Pain, Decreased coordination  Visit Diagnosis: Pain in right elbow  Pain in right upper arm  Cervicalgia  Pain in right wrist     Problem List Patient Active Problem List   Diagnosis Date Noted  . Low back pain 11/26/2019  . Nonallopathic lesion of lumbosacral region 05/16/2019  . Nonallopathic lesion of sacral region 05/16/2019  . Nonallopathic lesion of thoracic region 05/16/2019  . Lateral epicondylitis of right elbow 04/05/2019  . Greater trochanteric bursitis of right hip 04/05/2019  . Hashimoto's thyroiditis 05/10/2016  . PCOS (polycystic ovarian syndrome) 03/27/2015  . Irregular menses 03/27/2015   Hilda Lias DPT Hilda Lias 03/03/2020, 5:41 PM  [place] [facility] PHYS[facility] AND SPORTS MEDICINE [address], [place], [address] Phone: [phone]   Fax:  [phone]  Name: Stacey Parker MRN: [id number] Date of Birth: [birth year]

## 2020-03-04 ENCOUNTER — Ambulatory Visit: Payer: 59 | Admitting: Physical Therapy

## 2020-03-06 ENCOUNTER — Other Ambulatory Visit: Payer: Self-pay

## 2020-03-06 ENCOUNTER — Encounter: Payer: Self-pay | Admitting: Family Medicine

## 2020-03-06 ENCOUNTER — Ambulatory Visit: Payer: 59 | Admitting: Family Medicine

## 2020-03-06 ENCOUNTER — Ambulatory Visit: Payer: 59 | Admitting: Physical Therapy

## 2020-03-06 VITALS — BP 90/70 | HR 59 | Ht 64.0 in | Wt 148.0 lb

## 2020-03-06 DIAGNOSIS — M999 Biomechanical lesion, unspecified: Secondary | ICD-10-CM | POA: Diagnosis not present

## 2020-03-06 DIAGNOSIS — G8929 Other chronic pain: Secondary | ICD-10-CM | POA: Diagnosis not present

## 2020-03-06 DIAGNOSIS — M545 Low back pain: Secondary | ICD-10-CM

## 2020-03-06 NOTE — Progress Notes (Signed)
[name] [address] [place] [address] Phone: [phone] Subjective:   I Ronelle Nigh am serving as a Neurosurgeon for Dr. Antoine Primas.  This visit occurred during the SARS-CoV-2 public health emergency.  Safety protocols were in place, including screening questions prior to the visit, additional usage of staff PPE, and extensive cleaning of exam room while observing appropriate contact time as indicated for disinfecting solutions.   I'm seeing this patient by the request  of:  Patient, No Pcp Per  CC: Back pain, neck pain follow-up  [id number]  Stacey Parker is a 49 y.o. female coming in with complaint of back pain and lateral epicondylitis, right. OMT 01/14/2020. Patient states she is better. Going to PT. Has been doing dry needling. States PT has told her the neck is what causes the elbow issues. Hasn't started playing tennis. Upper trap, tricep and levator dry needling at PT. patient feels like they are on to something with focusing more on the neck at the moment.  His low back is improving as well.     Past Medical History:  Diagnosis Date  . Hashimoto's thyroiditis   . Polycystic ovary    Past Surgical History:  Procedure Laterality Date  . CESAREAN SECTION     x2   Social History   Socioeconomic History  . Marital status: Married    Spouse name: Not on file  . Number of children: Not on file  . Years of education: Not on file  . Highest education level: Not on file  Occupational History  . Not on file  Tobacco Use  . Smoking status: Never Smoker  . Smokeless tobacco: Never Used  Vaping Use  . Vaping Use: Never used  Substance and Sexual Activity  . Alcohol use: No    Alcohol/week: 0.0 standard drinks  . Drug use: No  . Sexual activity: Yes    Birth control/protection: Condom  Other Topics Concern  . Not on file  Social History Narrative  . Not on file   Social Determinants of Health   Financial Resource  Strain:   . Difficulty of Paying Living Expenses:   Food Insecurity:   . Worried About Programme researcher, broadcasting/film/video in the Last Year:   . Barista in the Last Year:   Transportation Needs:   . Freight forwarder (Medical):   [name] Lack of Transportation (Non-Medical):   Physical Activity:   . Days of Exercise per Week:   . Minutes of Exercise per Session:   Stress:   . Feeling of Stress :   Social Connections:   . Frequency of Communication with Friends and Family:   . Frequency of Social Gatherings with Friends and Family:   . Attends Religious Services:   . Active Member of Clubs or Organizations:   . Attends Banker Meetings:   [name] Marital Status:    Allergies  Allergen Reactions  . Sulfa Antibiotics Other (See Comments)   Family History  Problem Relation Age of Onset  . Breast cancer Maternal Aunt 58  . Thyroid disease Mother   . Heart attack Mother   . Prostate cancer Father   . Heart attack Maternal Grandmother   . Prostate cancer Paternal Grandfather     Current Outpatient Medications (Endocrine & Metabolic):  .  Progesterone Micronized (PROGESTERONE PO), Take by mouth. Take on days 12-26 through of cycle .  SYNTHROID 50 MCG tablet,   Current Outpatient Medications (  Cardiovascular):  .  nitroGLYCERIN (NITRO-DUR) 0.2 mg/hr patch, Apply 1/4 of a patch to skin once daily.   Current Outpatient Medications (Analgesics):  .  meloxicam (MOBIC) 7.5 MG tablet, Take 1 tablet (7.5 mg total) by mouth daily.   Current Outpatient Medications (Other):  [name]  Diclofenac Sodium (PENNSAID) 2 % SOLN, Place 2 g onto the skin 2 (two) times daily. .  Diclofenac Sodium 2 % SOLN, Place 2 g onto the skin 2 (two) times daily.   Reviewed prior external information including notes and imaging from  primary care provider As well as notes that were available from care everywhere and other healthcare systems.  Past medical history, social, surgical and family history all  reviewed in electronic medical record.  No pertanent information unless stated regarding to the chief complaint.   Review of Systems:  No headache, visual changes, nausea, vomiting, diarrhea, constipation, dizziness, abdominal pain, skin rash, fevers, chills, night sweats, weight loss, swollen lymph nodes, body aches, joint swelling, chest pain, shortness of breath, mood changes. POSITIVE muscle aches  Objective  Blood pressure 90/70, pulse (!) 59, height 5\' 4"  (1.626 m), weight 148 lb (67.1 kg), SpO2 98 %.   General: No apparent distress alert and oriented x3 mood and affect normal, dressed appropriately.  HEENT: Pupils equal, extraocular movements intact  Respiratory: Patient's speak in full sentences and does not appear short of breath  Cardiovascular: No lower extremity edema, non tender, no erythema  Neuro: Cranial nerves II through XII are intact, neurovascularly intact in all extremities with 2+ DTRs and 2+ pulses.  Gait normal with good balance and coordination.  MSK:  Non tender with full range of motion and good stability and symmetric strength and tone of shoulders,  wrist, hip, knee and ankles bilaterally.  Elbow seems significantly improved from previous exam. Focus more on patient's upper scapula today and patient does have some very mild scapular dyskinesis noted of the right side.  Patient does have some tenderness to palpation in the thoracolumbar juncture as well as still over the lumbosacral area.  Osteopathic findings C6 flexed rotated and side bent left T3 extended rotated and side bent right inhaled third rib T9 extended rotated and side bent left L2 flexed rotated and side bent right Sacrum right on right    Impression and Recommendations:     The above documentation has been reviewed and is accurate and complete , DO       Note: This dictation was prepared with Dragon dictation along with smaller phrase technology. Any transcriptional errors  that result from this process are unintentional.

## 2020-03-06 NOTE — Patient Instructions (Signed)
Posture and ergonomics are key Are you sure you don't want to throw away your rocker See me again in 6-8 weeks

## 2020-03-06 NOTE — Assessment & Plan Note (Signed)

## 2020-03-06 NOTE — Assessment & Plan Note (Signed)
Back pain and also seems to be scapular pain.  We discussed ergonomics, which activities to do which wants to avoid.  Patient is to increase activity slowly.  Discussed icing regimen.  Discussed avoiding certain activities that could be contributing to it as well.  Follow-up again in 4 to 8 weeks

## 2020-03-10 ENCOUNTER — Other Ambulatory Visit: Payer: Self-pay

## 2020-03-10 ENCOUNTER — Ambulatory Visit: Payer: 59 | Admitting: Physical Therapy

## 2020-03-10 ENCOUNTER — Encounter: Payer: Self-pay | Admitting: Physical Therapy

## 2020-03-10 DIAGNOSIS — M25521 Pain in right elbow: Secondary | ICD-10-CM

## 2020-03-10 DIAGNOSIS — M25531 Pain in right wrist: Secondary | ICD-10-CM | POA: Diagnosis not present

## 2020-03-10 DIAGNOSIS — M542 Cervicalgia: Secondary | ICD-10-CM | POA: Diagnosis not present

## 2020-03-10 DIAGNOSIS — M79621 Pain in right upper arm: Secondary | ICD-10-CM | POA: Diagnosis not present

## 2020-03-10 NOTE — Therapy (Signed)
[place] [facility] [address] [place], [address] Phone: [phone]   Fax:  [phone]  Physical Therapy Treatment  Patient Details  Name: Stacey Parker MRN: [id number] Date of Birth: 1970-10-23 No data recorded  Encounter Date: 03/10/2020   PT End of Session - 03/10/20 1001    Visit Number 10    Number of Visits 17    Date for PT Re-Evaluation 03/20/20    PT Start Time 0955    PT Stop Time 1034    PT Time Calculation (min) 39 min    Activity Tolerance Patient tolerated treatment well    Behavior During Therapy [facility] for tasks assessed/performed           Past Medical History:  Diagnosis Date   Hashimoto's thyroiditis    Polycystic ovary     Past Surgical History:  Procedure Laterality Date   CESAREAN SECTION     x2    There were no vitals filed for this visit.   Subjective Assessment - 03/10/20 1010    Subjective Patient reports she felt good following last session, no increased pain. Reports she is completing push ups at home pain free as well.    Pertinent History Pt is a 49 year old female presenting with R elbow pain that has been worsening over the past year following working at home with poor ergonomics. Patient was playing a lot of tennis last April and had to stop because of elbow pain. From April 2020-present she had stopped playing tennis, and was wearing a wrist immobilizer (24hours/day for 8 weeks, then just at night up until now) which did help with pain. She has been using therabar, and wrist gripper for strengthening. She was "cleared by MD" about a week ago, and returned to playing tennis with lighter rachet, elbow and wirst brace on 2x and had pain during serving and following at lateral elbow. She reports this pain is at distal tricep and lateral elbow (she noticed tricep pain initally) and reports this is a dull ache, with some sharp pain, that is aggravated by push ups/full  planks (she completes Barre workouts 5x/week), tennis, typing. Worst pain over past couple weeks 6/10 best 0/10. 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  Pt denies N/V, B&B changes, unexplained weight fluctuation, saddle paresthesia, fever, night sweats, or unrelenting night pain at this time.    Limitations Writing;House hold activities    How long can you sit comfortably? unlimited    How long can you stand comfortably? unlimited    How long can you walk comfortably? unlimited    Diagnostic tests None to date    Pain Onset 1 to 4 weeks ago           Ther-Ex Scapular pushes 3# x10; 5# 2x 10 with min cuing initially for technique, good carry over following  Push up from knees x10 with good carry over of proper technique Body blade inf/superior motion in R flex 3x 30sec  Cross body power "hook" 5# 3x 6 with cuing to prevent elbow hyperext with eccentric control return with good carry over Bilat shoulder ext BluTB 3x 10 with cuing for scapular retraction with elbow stabilization  Forehand swing with 3# DB x20; backhand x20 with good carry over of technique, min cuing to prevent elbow hyperext with forehand set up with good carry over  PT Education - 03/10/20 1001    Education Details therex form/technique    Person(s) Educated Patient    Methods Explanation;Demonstration;Verbal cues    Comprehension Verbalized understanding;Returned demonstration;Verbal cues required            PT Short Term Goals - 01/23/20 1344      PT SHORT TERM GOAL #1   Title Pt will be independent with HEP in order to improve strength and balance in order to decrease fall risk and improve function at home and work.    Baseline 01/23/20 HEP given    Time 4    Period Weeks    Status New             PT Long Term Goals - 01/23/20 1403      PT LONG TERM GOAL #1   Title  Patient will increase FOTO score to 65 to demonstrate predicted increase in functional mobility to complete ADLs    Baseline 01/23/20 50    Time 8    Period Weeks    Status New      PT LONG TERM GOAL #2   Title Pt will decrease worst pain as reported on NPRS by at least 3 points in order to demonstrate clinically significant reduction in pain.    Baseline 01/23/20 6/10 lateral elbow and tricep insertion pain    Time 8    Period Weeks    Status New      PT LONG TERM GOAL #3   Title Patient will demonstrate 5/5, pain free wrist/forearm strength in order to demonstrate PLOF symmetrical strength to LUE in order to complete exercise regimen    Baseline 01/23/20 painful R wrist ext 4+; supination 4/5, and radial deviation 4/5    Time 8    Period Weeks    Status New                 Plan - 03/10/20 1040    Clinical Impression Statement Pt continued therex progression for increased central power and periphreal joint eccentric strength/control with good success. Patient is able to ocmoplete all therex with proper technique following demonstration and cuing, with good carry over into functional tennis movements. PT will continue progression as able.    Personal Factors and Comorbidities Profession;Time since onset of injury/illness/exacerbation;Past/Current Experience    Examination-Activity Limitations Lift;Carry;Other    Examination-Participation Restrictions Meal Prep;Community Activity    Stability/Clinical Decision Making Evolving/Moderate complexity    Clinical Decision Making Moderate    Rehab Potential Good    PT Frequency 2x / week    PT Duration 8 weeks    PT Treatment/Interventions Spinal Manipulations;Joint Manipulations;Taping;Dry needling;Passive range of motion;Manual techniques;Patient/family education;Neuromuscular re-education;Therapeutic activities;DME Instruction;Traction;Ultrasound;Electrical Stimulation;Moist Heat;Functional mobility training;Therapeutic  exercise;Cognitive remediation;Iontophoresis 4mg /ml Dexamethasone;Cryotherapy    PT Next Visit Plan TDN; HEP review    PT Home Exercise Plan therex modification, UT/wrist ext/flex stretch    Consulted and Agree with Plan of Care Patient           Patient will benefit from skilled therapeutic intervention in order to improve the following deficits and impairments:  Decreased mobility, Impaired flexibility, Increased muscle spasms, Impaired tone, Postural dysfunction, Decreased activity tolerance, Decreased endurance, Decreased range of motion, Decreased strength, Increased fascial restricitons, Impaired UE functional use, Improper body mechanics, Pain, Decreased coordination  Visit Diagnosis: Pain in right elbow  Pain in right upper arm  Cervicalgia  Pain in right wrist     Problem List Patient Active Problem List   Diagnosis  Date Noted   Low back pain 11/26/2019   Nonallopathic lesion of lumbosacral region 05/16/2019   Nonallopathic lesion of sacral region 05/16/2019   Nonallopathic lesion of thoracic region 05/16/2019   Lateral epicondylitis of right elbow 04/05/2019   Greater trochanteric bursitis of right hip 04/05/2019   Hashimoto's thyroiditis 05/10/2016   PCOS (polycystic ovarian syndrome) 03/27/2015   Irregular menses 03/27/2015   Hilda Lias DPT Hilda Lias 03/10/2020, 10:42 AM  [place] [facility] [address] [place], [address] Phone: [phone]   Fax:  [phone]  Name: Stacey Parker MRN: [id number] Date of Birth: [birth year]

## 2020-03-17 ENCOUNTER — Ambulatory Visit: Payer: 59 | Admitting: Physical Therapy

## 2020-03-17 ENCOUNTER — Encounter: Payer: 59 | Admitting: Physical Therapy

## 2020-03-19 ENCOUNTER — Ambulatory Visit: Payer: 59 | Admitting: Physical Therapy

## 2020-03-19 ENCOUNTER — Other Ambulatory Visit: Payer: Self-pay

## 2020-03-19 ENCOUNTER — Encounter: Payer: Self-pay | Admitting: Physical Therapy

## 2020-03-19 DIAGNOSIS — M542 Cervicalgia: Secondary | ICD-10-CM | POA: Diagnosis not present

## 2020-03-19 DIAGNOSIS — M25521 Pain in right elbow: Secondary | ICD-10-CM

## 2020-03-19 DIAGNOSIS — M79621 Pain in right upper arm: Secondary | ICD-10-CM | POA: Diagnosis not present

## 2020-03-19 DIAGNOSIS — M25531 Pain in right wrist: Secondary | ICD-10-CM

## 2020-03-19 NOTE — Therapy (Signed)
[facility] [address], [place], [address] Phone: [phone]   Fax:  [phone]  Physical Therapy Treatment  Patient Details  Name: Stacey Parker MRN: [id number] Date of Birth: 1971-07-15 No data recorded  Encounter Date: 03/19/2020    Past Medical History:  Diagnosis Date  . Hashimoto's thyroiditis   . Polycystic ovary     Past Surgical History:  Procedure Laterality Date  . CESAREAN SECTION     x2    There were no vitals filed for this visit.     Ther-Ex Nustep L3 84mins seat setting 8 UE 8 for increased protraction/retraction strengthening PT reviewed the following HEP with patient with patient able to demonstrate a set of the following with min cuing for correction needed. PT educated patient on parameters of therex (how/when to inc/decrease intensity, frequency, rep/set range, stretch hold time, and purpose of therex) with verbalized understanding.  Access Code: [email or web address] Standing Bent Over Single Arm Scapular Row with Table Support - 1 x daily - 2 x weekly - 3 sets - 5-10 reps Prone Scapular Retraction Y - 1 x daily - 2 x weekly - 3 sets - 5-10 reps Standing High Row with Resistance - 1 x daily - 2 x weekly - 3 sets - 5-10 reps Standing Row with Anchored Resistance - 1 x daily - 2 x weekly - 3 sets - 5-10 reps Shoulder extension with resistance - Neutral - 1 x daily - 2 x weekly - 3 sets - 5-10 reps                              PT Short Term Goals - 03/19/20 1350      PT SHORT TERM GOAL #1   Title Pt will be independent with HEP in order to improve strength and balance in order to decrease fall risk and improve function at home and work.    Baseline 01/23/20 HEP given; 03/19/20 completing current HEP, HEP updated    Time 4    Period Weeks    Status Achieved             PT Long Term Goals - 03/19/20 1351      PT LONG TERM GOAL #1   Title Patient will  increase FOTO score to 65 to demonstrate predicted increase in functional mobility to complete ADLs    Baseline 01/23/20 69    Time 8    Period Weeks    Status Achieved      PT LONG TERM GOAL #2   Title Pt will decrease worst pain as reported on NPRS by at least 3 points in order to demonstrate clinically significant reduction in pain.    Baseline 01/23/20 6/10 lateral elbow and tricep insertion pain; 03/19/20 no pain over the past week    Time 8    Status Achieved      PT LONG TERM GOAL #3   Title Patient will demonstrate 5/5, pain free wrist/forearm strength in order to demonstrate PLOF symmetrical strength to LUE in order to complete exercise regimen    Baseline 01/23/20 5/5 elbow and wrist strength with no pain    Time 8    Period Weeks    Status Achieved                 Plan - 03/19/20 1443    Clinical Impression Statement PT re-assessed goals to d/c  patient this visit. Patient is able to demonstrate and understand all recommends for d/c and HEP. Patient has met all goals to d/c PT. Patient given clinic contact info should any questions/concerns arise.    Personal Factors and Comorbidities Profession;Time since onset of injury/illness/exacerbation;Past/Current Experience    Examination-Activity Limitations Lift;Carry;Other    Examination-Participation Restrictions Meal Prep;Community Activity    Stability/Clinical Decision Making Evolving/Moderate complexity    Clinical Decision Making Moderate    Rehab Potential Good    PT Frequency 2x / week    PT Duration 8 weeks    PT Treatment/Interventions Spinal Manipulations;Joint Manipulations;Taping;Dry needling;Passive range of motion;Manual techniques;Patient/family education;Neuromuscular re-education;Therapeutic activities;DME Instruction;Traction;Ultrasound;Electrical Stimulation;Moist Heat;Functional mobility training;Therapeutic exercise;Cognitive remediation;Iontophoresis 4mg /ml Dexamethasone;Cryotherapy    PT Next Visit Plan  d/c PT    PT Home Exercise Plan therex modification, UT/wrist ext/flex stretch    Consulted and Agree with Plan of Care Patient           Patient will benefit from skilled therapeutic intervention in order to improve the following deficits and impairments:  Decreased mobility, Impaired flexibility, Increased muscle spasms, Impaired tone, Postural dysfunction, Decreased activity tolerance, Decreased endurance, Decreased range of motion, Decreased strength, Increased fascial restricitons, Impaired UE functional use, Improper body mechanics, Pain, Decreased coordination  Visit Diagnosis: Pain in right elbow  Pain in right upper arm  Cervicalgia  Pain in right wrist     Problem List Patient Active Problem List   Diagnosis Date Noted  . Low back pain 11/26/2019  . Nonallopathic lesion of lumbosacral region 05/16/2019  . Nonallopathic lesion of sacral region 05/16/2019  . Nonallopathic lesion of thoracic region 05/16/2019  . Lateral epicondylitis of right elbow 04/05/2019  . Greater trochanteric bursitis of right hip 04/05/2019  . Hashimoto's thyroiditis 05/10/2016  . PCOS (polycystic ovarian syndrome) 03/27/2015  . Irregular menses 03/27/2015   Durwin Reges DPT Durwin Reges 03/19/2020, 2:58 PM  [facility] [address] [place], [address] Phone: [phone]   Fax:  [phone]  Name: Stacey Parker MRN: [id number] Date of Birth: 1971-01-14

## 2020-03-24 ENCOUNTER — Encounter: Payer: 59 | Admitting: Physical Therapy

## 2020-03-24 ENCOUNTER — Ambulatory Visit: Payer: 59 | Admitting: Physical Therapy

## 2020-03-26 ENCOUNTER — Encounter: Payer: 59 | Admitting: Physical Therapy

## 2020-03-31 ENCOUNTER — Encounter: Payer: 59 | Admitting: Physical Therapy

## 2020-04-10 DIAGNOSIS — K589 Irritable bowel syndrome without diarrhea: Secondary | ICD-10-CM | POA: Diagnosis not present

## 2020-04-10 DIAGNOSIS — E559 Vitamin D deficiency, unspecified: Secondary | ICD-10-CM | POA: Diagnosis not present

## 2020-04-10 DIAGNOSIS — E282 Polycystic ovarian syndrome: Secondary | ICD-10-CM | POA: Diagnosis not present

## 2020-04-10 DIAGNOSIS — E063 Autoimmune thyroiditis: Secondary | ICD-10-CM | POA: Diagnosis not present

## 2020-04-16 ENCOUNTER — Other Ambulatory Visit: Payer: Self-pay

## 2020-04-21 ENCOUNTER — Encounter: Payer: Self-pay | Admitting: Family Medicine

## 2020-04-21 ENCOUNTER — Ambulatory Visit: Payer: 59 | Admitting: Family Medicine

## 2020-04-21 ENCOUNTER — Other Ambulatory Visit: Payer: Self-pay

## 2020-04-21 VITALS — BP 100/80 | HR 63 | Ht 64.0 in | Wt 149.0 lb

## 2020-04-21 DIAGNOSIS — M999 Biomechanical lesion, unspecified: Secondary | ICD-10-CM | POA: Diagnosis not present

## 2020-04-21 DIAGNOSIS — G8929 Other chronic pain: Secondary | ICD-10-CM | POA: Diagnosis not present

## 2020-04-21 DIAGNOSIS — M545 Low back pain: Secondary | ICD-10-CM

## 2020-04-21 NOTE — Progress Notes (Signed)
[name] [address] [place] [address] Phone: [phone] Subjective:   I Stacey Parker am serving as a Neurosurgeon for Dr. Antoine Primas.  This visit occurred during the SARS-CoV-2 public health emergency.  Safety protocols were in place, including screening questions prior to the visit, additional usage of staff PPE, and extensive cleaning of exam room while observing appropriate contact time as indicated for disinfecting solutions.   I'm seeing this patient by the request  of:  Patient, No Pcp Per  CC: Low back pain follow-up  [id number]  Stacey Parker is a 49 y.o. female coming in with complaint of back and neck pain. OMT 03/06/2020.  Patient is also had right elbow pain with more of a lateral epicondylitis.  Has been doing physical therapy through August.  Patient states she is doing better. Finished PT. Was released at the end of last month. Knows her pain has to do with ergonomics. 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 States her body mechanics are off and she is relearning tennis.   Medications patient has been prescribed: Meloxicam  Taking: Intermittently         Reviewed prior external information including notes and imaging from previsou exam, outside providers and external EMR if available.   As well as notes that were available from care everywhere and other healthcare systems.  Past medical history, social, surgical and family history all reviewed in electronic medical record.  No pertanent information unless stated regarding to the chief complaint.   Past Medical History:  Diagnosis Date   Hashimoto's thyroiditis    Polycystic ovary     Allergies  Allergen Reactions   Sulfa Antibiotics Other (See Comments)     Review of Systems:  No headache, visual changes, nausea, vomiting, diarrhea, constipation, dizziness, abdominal pain, skin rash, fevers, chills, night sweats, weight  loss, swollen lymph nodes, body aches, joint swelling, chest pain, shortness of breath, mood changes. POSITIVE muscle aches  Objective  Blood pressure 100/80, pulse 63, height 5\' 4"  (1.626 m), weight 149 lb (67.6 kg), SpO2 97 %.   General: No apparent distress alert and oriented x3 mood and affect normal, dressed appropriately.  HEENT: Pupils equal, extraocular movements intact  Respiratory: Patient's speak in full sentences and does not appear short of breath  Cardiovascular: No lower extremity edema, non tender, no erythema  Neuro: Cranial nerves II through XII are intact, neurovascularly intact in all extremities with 2+ DTRs and 2+ pulses.  Gait normal with good balance and coordination.  MSK:  Non tender with full range of motion and good stability and symmetric strength and tone of shoulders, elbows, wrist, hip, knee and ankles bilaterally.  Back - Normal skin, Spine with normal alignment and no deformity.  No tenderness to vertebral process palpation.  Paraspinous muscles are not tender and without spasm.   Range of motion is full at neck and lumbar sacral regions  Osteopathic findings   T7 extended rotated and side bent left L2 flexed rotated and side bent right Sacrum right on right       Assessment and Plan:  Low back pain Low back pain is multifactorial.  Discussed with patient in great length.  Chronic problem with mild exacerbation.  Discussed medications including the meloxicam we have prescribed previously.  Discussed home exercise, can do injections if necessary but I believe patient will continue to do relatively well with conservative therapy.  Follow-up again in 6 to  8 weeks Neck pain seems to be more multifactorial as well.  I do think it is more stress and ergonomics.  Patient will work on that as well.  Likely do more manipulation of the neck at next follow-up  Nonallopathic problems  Decision today to treat with OMT was based on Physical Exam  After verbal  consent patient was treated with HVLA, ME, FPR techniques in  thoracic, lumbar, and sacral  areas  Patient tolerated the procedure well with improvement in symptoms  Patient given exercises, stretches and lifestyle modifications  See medications in patient instructions if given  Patient will follow up in 6-8 weeks      The above documentation has been reviewed and is accurate and complete Judi Saa, DO       Note: This dictation was prepared with Dragon dictation along with smaller phrase technology. Any transcriptional errors that result from this process are unintentional.

## 2020-04-21 NOTE — Patient Instructions (Addendum)
Good to see you Yoga wheel Keep learning tennis See me again in 8-10 weeks

## 2020-04-21 NOTE — Assessment & Plan Note (Signed)
Low back pain is multifactorial.  Discussed with patient in great length.  Chronic problem with mild exacerbation.  Discussed medications including the meloxicam we have prescribed previously.  Discussed home exercise, can do injections if necessary but I believe patient will continue to do relatively well with conservative therapy.  Follow-up again in 6 to 8 weeks

## 2020-06-30 ENCOUNTER — Telehealth: Payer: 59 | Admitting: Physician Assistant

## 2020-06-30 DIAGNOSIS — R6889 Other general symptoms and signs: Secondary | ICD-10-CM | POA: Diagnosis not present

## 2020-06-30 DIAGNOSIS — Z20828 Contact with and (suspected) exposure to other viral communicable diseases: Secondary | ICD-10-CM | POA: Diagnosis not present

## 2020-06-30 MED ORDER — OSELTAMIVIR PHOSPHATE 75 MG PO CAPS: 75.0000 mg | ORAL_CAPSULE | Freq: Two times a day (BID) | ORAL | 0 refills | Status: DC

## 2020-06-30 NOTE — Progress Notes (Addendum)
E visit for Flu like symptoms   We are sorry that you are not feeling well.  Here is how we plan to help! Based on what you have shared with me it looks like you may have a respiratory virus that may be influenza.  Influenza or "the flu" is   an infection caused by a respiratory virus. The flu virus is highly contagious and persons who did not receive their yearly flu vaccination may "catch" the flu from close contact.  We have anti-viral medications to treat the viruses that cause this infection. They are not a "cure" and only shorten the course of the infection. These prescriptions are most effective when they are given within the first 2 days of "flu" symptoms. Antiviral medication are indicated if you have a high risk of complications from the flu. You should  also consider an antiviral medication if you are in close contact with someone who is at risk. These medications can help patients avoid complications from the flu  but have side effects that you should know. Possible side effects from Tamiflu or oseltamivir include nausea, vomiting, diarrhea, dizziness, headaches, eye redness, sleep problems or other respiratory symptoms. You should not take Tamiflu if you have an allergy to oseltamivir or any to the ingredients in Tamiflu.  Based upon your symptoms and potential risk factors I have prescribed Oseltamivir (Tamiflu).  It has been sent to your designated pharmacy.  You will take one 75 mg capsule orally twice a day for the next 5 days. and I recommend that you follow the flu symptoms recommendation that I have listed below.  ANYONE WHO HAS FLU SYMPTOMS SHOULD: . Stay home. The flu is highly contagious and going out or to work exposes others! . Be sure to drink plenty of fluids. Water is fine as well as fruit juices, sodas and electrolyte beverages. You may want to stay away from caffeine or alcohol. If you are nauseated, try taking small sips of liquids. How do you know if you are getting  enough fluid? Your urine should be a pale yellow or almost colorless. . Get rest. . Taking a steamy shower or using a humidifier may help nasal congestion and ease sore throat pain. Using a saline nasal spray works much the same way. . Cough drops, hard candies and sore throat lozenges may ease your cough. . Line up a caregiver. Have someone check on you regularly.   GET HELP RIGHT AWAY IF: . You cannot keep down liquids or your medications. . You become short of breath . Your fell like you are going to pass out or loose consciousness. . Your symptoms persist after you have completed your treatment plan MAKE SURE YOU   Understand these instructions.  Will watch your condition.  Will get help right away if you are not doing well or get worse.  Your e-visit answers were reviewed by a board certified advanced clinical practitioner to complete your personal care plan.  Depending on the condition, your plan could have included both over the counter or prescription medications.  If there is a problem please reply  once you have received a response from your provider.  Your safety is important to [place].  If you have drug allergies check your prescription carefully.    You can use MyChart to ask questions about today's visit, request a non-urgent call back, or ask for a work or school excuse for 24 hours related to this e-Visit. If it has been greater than 24  hours you will need to follow up with your provider, or enter a new e-Visit to address those concerns.  You will get an e-mail in the next two days asking about your experience.  I hope that your e-visit has been valuable and will speed your recovery. Thank you for using e-visits.  Greater than 5 minutes, yet less than 10 minutes of time have been spent researching, coordinating and implementing care for this patient today.

## 2020-07-01 ENCOUNTER — Ambulatory Visit: Payer: 59 | Admitting: Family Medicine

## 2020-07-10 DIAGNOSIS — K589 Irritable bowel syndrome without diarrhea: Secondary | ICD-10-CM | POA: Diagnosis not present

## 2020-07-10 DIAGNOSIS — E063 Autoimmune thyroiditis: Secondary | ICD-10-CM | POA: Diagnosis not present

## 2020-07-10 DIAGNOSIS — E282 Polycystic ovarian syndrome: Secondary | ICD-10-CM | POA: Diagnosis not present

## 2020-08-03 NOTE — Progress Notes (Deleted)
  [name] [address] [place] [address] Phone: [phone] Subjective:    I'm seeing this patient by the request  of:  Patient, No Pcp Per  CC:   [id number]  MOKSHA DORGAN is a 50 y.o. female coming in with complaint of back and neck pain. OMT 04/21/2020. Patient states   Medications patient has been prescribed: None  Taking:         Reviewed prior external information including notes and imaging from previsou exam, outside providers and external EMR if available.   As well as notes that were available from care everywhere and other healthcare systems.  Past medical history, social, surgical and family history all reviewed in electronic medical record.  No pertanent information unless stated regarding to the chief complaint.   Past Medical History:  Diagnosis Date  . Hashimoto's thyroiditis   . Polycystic ovary     Allergies  Allergen Reactions  . Sulfa Antibiotics Other (See Comments)     Review of Systems:  No headache, visual changes, nausea, vomiting, diarrhea, constipation, dizziness, abdominal pain, skin rash, fevers, chills, night sweats, weight loss, swollen lymph nodes, body aches, joint swelling, chest pain, shortness of breath, mood changes. POSITIVE muscle aches  Objective  There were no vitals taken for this visit.   General: No apparent distress alert and oriented x3 mood and affect normal, dressed appropriately.  HEENT: Pupils equal, extraocular movements intact  Respiratory: Patient's speak in full sentences and does not appear short of breath  Cardiovascular: No lower extremity edema, non tender, no erythema  Neuro: Cranial nerves II through XII are intact, neurovascularly intact in all extremities with 2+ DTRs and 2+ pulses.  Gait normal with good balance and coordination.  MSK:  Non tender with full range of motion and good stability and symmetric strength and tone of shoulders, elbows, wrist,  hip, knee and ankles bilaterally.  Back - Normal skin, Spine with normal alignment and no deformity.  No tenderness to vertebral process palpation.  Paraspinous muscles are not tender and without spasm.   Range of motion is full at neck and lumbar sacral regions  Osteopathic findings  C2 flexed rotated and side bent right C6 flexed rotated and side bent left T3 extended rotated and side bent right inhaled rib T9 extended rotated and side bent left L2 flexed rotated and side bent right Sacrum right on right       Assessment and Plan:    Nonallopathic problems  Decision today to treat with OMT was based on Physical Exam  After verbal consent patient was treated with HVLA, ME, FPR techniques in cervical, rib, thoracic, lumbar, and sacral  areas  Patient tolerated the procedure well with improvement in symptoms  Patient given exercises, stretches and lifestyle modifications  See medications in patient instructions if given  Patient will follow up in 4-8 weeks      The above documentation has been reviewed and is accurate and complete Judi Saa, DO       Note: This dictation was prepared with Dragon dictation along with smaller phrase technology. Any transcriptional errors that result from this process are unintentional.

## 2020-08-04 ENCOUNTER — Ambulatory Visit: Payer: 59 | Admitting: Family Medicine

## 2020-08-04 NOTE — Progress Notes (Unsigned)
[name] [address] [place] [address] Phone: [phone] Subjective:   [name], am serving as a scribe for Dr. Antoine Primas. This visit occurred during the SARS-CoV-2 public health emergency.  Safety protocols were in place, including screening questions prior to the visit, additional usage of staff PPE, and extensive cleaning of exam room while observing appropriate contact time as indicated for disinfecting solutions.   I'm seeing this patient by the request  of:  Patient, No Pcp Per  CC: Back pain, neck pain follow-up  [id number]  Stacey Parker is a 50 y.o. female coming in with complaint of back and neck pain. OMT 04/21/2020. Patient states that physical therapy helped a lot. Was sent home yesterday to work from home and already notes pain beginning in cervical spine. Right sided lower back pain as well but much better overall.  Patient states that the elbow is doing significantly better.  Patient did get the flu and has not been doing any type of tennis since being sick.  Medications patient has been prescribed: Meloxicam          Reviewed prior external information including notes and imaging from previsou exam, outside providers and external EMR if available.   As well as notes that were available from care everywhere and other healthcare systems.  Past medical history, social, surgical and family history all reviewed in electronic medical record.  No pertanent information unless stated regarding to the chief complaint.   Past Medical History:  Diagnosis Date  . Hashimoto's thyroiditis   . Polycystic ovary     Allergies  Allergen Reactions  . Sulfa Antibiotics Other (See Comments)     Review of Systems:  No headache, visual changes, nausea, vomiting, diarrhea, constipation, dizziness, abdominal pain, skin rash, fevers, chills, night sweats, weight loss, swollen lymph nodes, body aches, joint swelling, chest  pain, shortness of breath, mood changes. POSITIVE muscle aches  Objective  Blood pressure 100/78, pulse 62, height 5\' 4"  (1.626 m), weight 148 lb (67.1 kg), SpO2 97 %.   General: No apparent distress alert and oriented x3 mood and affect normal, dressed appropriately.  HEENT: Pupils equal, extraocular movements intact  Respiratory: Patient's speak in full sentences and does not appear short of breath  Cardiovascular: No lower extremity edema, non tender, no erythema  Gait normal with good balance and coordination.  MSK:  Non tender with full range of motion and good stability and symmetric strength and tone of shoulders, elbows, wrist, hip, knee and ankles bilaterally.  Left and right elbow is unremarkable today Back -neck exam does have some mild loss of lordosis.  Negative Spurling's.  Tightness noted in the right parascapular region. Patient also has some tenderness over the right sacroiliac joint.  Positive Faber on the right.   Osteopathic findings  C2 flexed rotated and side bent right T3 extended rotated and side bent right inhaled rib L2 flexed rotated and side bent right Sacrum right on right        Assessment and Plan:  Low back pain Patient overall doing well.  Patient did get the flu and has not been quite as active as usual.  Is going to start playing tennis again.  Patient's shoulder is feeling significantly better as well as her elbow.  Patient does respond well to manipulation follow-up again in 3 months.  Cervical pain (neck) Patient does have some mild cervical pain that I think is more possible.  Does respond well  to osteopathic manipulation.  Discussed icing regimen and home exercises.  Patient will increase activity slowly.  Follow-up again in 2 to 3 months    Nonallopathic problems  Decision today to treat with OMT was based on Physical Exam  After verbal consent patient was treated with HVLA, ME, FPR techniques in cervical, rib, thoracic, lumbar, and  sacral  areas  Patient tolerated the procedure well with improvement in symptoms  Patient given exercises, stretches and lifestyle modifications  See medications in patient instructions if given  Patient will follow up in 4-8 weeks      The above documentation has been reviewed and is accurate and complete Judi Saa, DO       Note: This dictation was prepared with Dragon dictation along with smaller phrase technology. Any transcriptional errors that result from this process are unintentional.

## 2020-08-05 ENCOUNTER — Encounter: Payer: Self-pay | Admitting: Family Medicine

## 2020-08-05 ENCOUNTER — Other Ambulatory Visit: Payer: Self-pay

## 2020-08-05 ENCOUNTER — Ambulatory Visit: Payer: 59 | Admitting: Family Medicine

## 2020-08-05 VITALS — BP 100/78 | HR 62 | Ht 64.0 in | Wt 148.0 lb

## 2020-08-05 DIAGNOSIS — G8929 Other chronic pain: Secondary | ICD-10-CM

## 2020-08-05 DIAGNOSIS — M999 Biomechanical lesion, unspecified: Secondary | ICD-10-CM | POA: Diagnosis not present

## 2020-08-05 DIAGNOSIS — M542 Cervicalgia: Secondary | ICD-10-CM | POA: Diagnosis not present

## 2020-08-05 DIAGNOSIS — M545 Low back pain, unspecified: Secondary | ICD-10-CM

## 2020-08-05 NOTE — Assessment & Plan Note (Signed)
Patient overall doing well.  Patient did get the flu and has not been quite as active as usual.  Is going to start playing tennis again.  Patient's shoulder is feeling significantly better as well as her elbow.  Patient does respond well to manipulation follow-up again in 3 months.

## 2020-08-05 NOTE — Assessment & Plan Note (Signed)
Patient does have some mild cervical pain that I think is more possible.  Does respond well to osteopathic manipulation.  Discussed icing regimen and home exercises.  Patient will increase activity slowly.  Follow-up again in 2 to 3 months

## 2020-08-05 NOTE — Patient Instructions (Signed)
Watch posture (At work) [employer] in tennis See me again in 2-3 months

## 2020-08-12 DIAGNOSIS — H524 Presbyopia: Secondary | ICD-10-CM | POA: Diagnosis not present

## 2020-08-19 ENCOUNTER — Other Ambulatory Visit: Payer: Self-pay

## 2020-10-02 ENCOUNTER — Ambulatory Visit: Payer: 59 | Admitting: Family Medicine

## 2020-10-12 ENCOUNTER — Telehealth: Payer: Self-pay | Admitting: Family Medicine

## 2020-10-12 NOTE — Telephone Encounter (Signed)
Patient called stating "I threw my back out" and asked if she would be able to be worked in this week to see Dr Katrinka Blazing. She has an appointment next week but did not think she could wait that long. Any advice?

## 2020-10-12 NOTE — Telephone Encounter (Signed)
Sent patient MyChart message with appt offered on Friday this week.

## 2020-10-14 NOTE — Progress Notes (Unsigned)
[name] [address] [place] [address] Phone: [phone] Subjective:   I Stacey Parker am serving as a Neurosurgeon for Dr. Antoine Primas.  This visit occurred during the SARS-CoV-2 public health emergency.  Safety protocols were in place, including screening questions prior to the visit, additional usage of staff PPE, and extensive cleaning of exam room while observing appropriate contact time as indicated for disinfecting solutions.   I'm seeing this patient by the request  of:  Patient, No Pcp Per  CC: Low back pain follow-up  [id number]  Stacey Parker is a 50 y.o. female coming in with complaint of back and neck pain. OMT 08/05/2020. Patient states she has not been doing great. States she hasn't had pain like this in a while. Bent over to pick something up when she felt a pop in her left lower back. Injury happened last Friday. Remembers walking a lot in [place] the week before and believes she is tight. 75% better with stretching, heat and Ibuprofen. Hasn't been doing any activity since the injury.   Medications patient has been prescribed: None  Taking:         Reviewed prior external information including notes and imaging from previsou exam, outside providers and external EMR if available.   As well as notes that were available from care everywhere and other healthcare systems.  Past medical history, social, surgical and family history all reviewed in electronic medical record.  No pertanent information unless stated regarding to the chief complaint.   Past Medical History:  Diagnosis Date  . Hashimoto's thyroiditis   . Polycystic ovary     Allergies  Allergen Reactions  . Sulfa Antibiotics Other (See Comments)     Review of Systems:  No headache, visual changes, nausea, vomiting, diarrhea, constipation, dizziness, abdominal pain, skin rash, fevers, chills, night sweats, weight loss, swollen lymph nodes, body aches, joint  swelling, chest pain, shortness of breath, mood changes. POSITIVE muscle aches  Objective  Blood pressure 100/70, pulse 62, height 5\' 4"  (1.626 m), weight 150 lb (68 kg), SpO2 99 %.   General: No apparent distress alert and oriented x3 mood and affect normal, dressed appropriately.  HEENT: Pupils equal, extraocular movements intact  Respiratory: Patient's speak in full sentences and does not appear short of breath  Cardiovascular: No lower extremity edema, non tender, no erythema  Gait normal with good balance and coordination.  MSK:  Non tender with full range of motion and good stability and symmetric strength and tone of shoulders, elbows, wrist, hip, knee and ankles bilaterally.  Back -tightness noted in the paraspinal musculature of the lumbar spine.  Seems to be more in the thoracolumbar juncture.  Tightness more on the right than the left.  Patient does have some discomfort with left-sided with right-sided back pain though.  Negative straight leg test.  5 5 strength of the lower extremities.  Patient is very cautious with range of motion but does have near full range of motion.  Osteopathic findings   T9 extended rotated and side bent left L2 flexed rotated and side bent right Sacrum right on right       Assessment and Plan:  Low back pain Low back pain.  Patient had acute onset of low back pain.  Seem to be secondary to walking a significant amount more.  Patient did do some traveling.  Likely more tightness of hip flexors.  Attempted osteopathic manipulation with some very good improvement.  Patient  given prednisone and is encouraged if worsening pain to take it but patient is going to hold on the other anti-inflammatories if she does.  We discussed home exercises and given more different exercises.  Follow-up with me again in 3 to 4 weeks.    Nonallopathic problems  Decision today to treat with OMT was based on Physical Exam  After verbal consent patient was treated  with HVLA, ME, FPR techniques in  thoracic, lumbar, and sacral  areas  Patient tolerated the procedure well with improvement in symptoms  Patient given exercises, stretches and lifestyle modifications  See medications in patient instructions if given  Patient will follow up in 4-8 weeks      The above documentation has been reviewed and is accurate and complete Judi Saa, DO       Note: This dictation was prepared with Dragon dictation along with smaller phrase technology. Any transcriptional errors that result from this process are unintentional.

## 2020-10-15 ENCOUNTER — Other Ambulatory Visit: Payer: Self-pay | Admitting: Family Medicine

## 2020-10-15 ENCOUNTER — Other Ambulatory Visit: Payer: Self-pay

## 2020-10-15 ENCOUNTER — Ambulatory Visit: Payer: 59 | Admitting: Family Medicine

## 2020-10-15 ENCOUNTER — Encounter: Payer: Self-pay | Admitting: Family Medicine

## 2020-10-15 VITALS — BP 100/70 | HR 62 | Ht 64.0 in | Wt 150.0 lb

## 2020-10-15 DIAGNOSIS — M545 Low back pain, unspecified: Secondary | ICD-10-CM | POA: Diagnosis not present

## 2020-10-15 DIAGNOSIS — M999 Biomechanical lesion, unspecified: Secondary | ICD-10-CM

## 2020-10-15 DIAGNOSIS — G8929 Other chronic pain: Secondary | ICD-10-CM | POA: Diagnosis not present

## 2020-10-15 MED ORDER — PREDNISONE 20 MG PO TABS: 20.0000 mg | ORAL_TABLET | Freq: Every day | ORAL | 0 refills | Status: DC

## 2020-10-15 MED ORDER — TIZANIDINE HCL 2 MG PO TABS: 2.0000 mg | ORAL_TABLET | Freq: Every day | ORAL | 0 refills | Status: DC

## 2020-10-15 NOTE — Patient Instructions (Addendum)
Good to see you Prednisone 20 mg start tomorrow for 5 days Zanaflex 2 mg at night if needed Hip flexor exercises I think you should be ok for tennis on tuesday See me again in 3-4 weeks

## 2020-10-15 NOTE — Assessment & Plan Note (Signed)
Low back pain.  Patient had acute onset of low back pain.  Seem to be secondary to walking a significant amount more.  Patient did do some traveling.  Likely more tightness of hip flexors.  Attempted osteopathic manipulation with some very good improvement.  Patient given prednisone and is encouraged if worsening pain to take it but patient is going to hold on the other anti-inflammatories if she does.  We discussed home exercises and given more different exercises.  Follow-up with me again in 3 to 4 weeks.

## 2020-10-20 ENCOUNTER — Ambulatory Visit: Payer: 59 | Admitting: Family Medicine

## 2020-10-26 ENCOUNTER — Other Ambulatory Visit: Payer: Self-pay

## 2020-11-11 NOTE — Progress Notes (Signed)
[name] [address] [place] [address] Phone: [phone] Subjective:   Stacey Parker, am serving as a scribe for Dr. Antoine Primas. This visit occurred during the SARS-CoV-2 public health emergency.  Safety protocols were in place, including screening questions prior to the visit, additional usage of staff PPE, and extensive cleaning of exam room while observing appropriate contact time as indicated for disinfecting solutions.   I'm seeing this patient by the request  of:  Patient, No Pcp Per (Inactive)  CC: Low back pain follow-up  [id number]  Stacey Parker is a 50 y.o. female coming in with complaint of back and neck pain. OMT 10/15/2020. Patient states that she has good and bad days.   Pain is more after playing tennis. Lower back pain is more sore than arm today. Patient played yesterday. Wears brace with playing. Using [place] as well. Feels tight across bottom of lumbar spine.   Medications patient has been prescribed: Zanaflex          Reviewed prior external information including notes and imaging from previsou exam, outside providers and external EMR if available.   As well as notes that were available from care everywhere and other healthcare systems.  Past medical history, social, surgical and family history all reviewed in electronic medical record.  No pertanent information unless stated regarding to the chief complaint.   Past Medical History:  Diagnosis Date  . Hashimoto's thyroiditis   . Polycystic ovary     Allergies  Allergen Reactions  . Sulfa Antibiotics Other (See Comments)     Review of Systems:  No headache, visual changes, nausea, vomiting, diarrhea, constipation, dizziness, abdominal pain, skin rash, fevers, chills, night sweats, weight loss, swollen lymph nodes, body aches, joint swelling, chest pain, shortness of breath, mood changes. POSITIVE muscle aches  Objective  Blood pressure  102/72, pulse 64, height 5\' 4"  (1.626 m), weight 148 lb (67.1 kg), SpO2 97 %.   General: No apparent distress alert and oriented x3 mood and affect normal, dressed appropriately.  HEENT: Pupils equal, extraocular movements intact  Respiratory: Patient's speak in full sentences and does not appear short of breath  Cardiovascular: No lower extremity edema, non tender, no erythema  Gait normal with good balance and coordination.  MSK:  Non tender with full range of motion and good stability and symmetric strength and tone of shoulders, elbows, wrist, hip, knee and ankles bilaterally.  Back -low back exam does have some mild loss of lordosis, the paraspinal musculature right sacroiliac joint more than the left.  Osteopathic findings  T4 extended rotated and side bent left L1 flexed rotated and side bent right Sacrum right on right       Assessment and Plan:  Low back pain Patient's low back still seems to be a chronic problem with mild exacerbation.  Patient does have meloxicam for breakthrough pain if necessary.  Patient will be traveling and going to the knees in part that could cause some exacerbation.  Discussed hip abductor strengthening benefit will be beneficial.  Increase activity slowly.  Follow-up with me again 6 weeks    Nonallopathic problems  Decision today to treat with OMT was based on Physical Exam  After verbal consent patient was treated with HVLA, ME, FPR techniques in  thoracic, lumbar, and sacral  areas  Patient tolerated the procedure well with improvement in symptoms  Patient given exercises, stretches and lifestyle modifications  See medications in patient instructions if given  Patient will follow up in 4-8 weeks      The above documentation has been reviewed and is accurate and complete Judi Saa, DO       Note: This dictation was prepared with Dragon dictation along with smaller phrase technology. Any transcriptional errors that result from  this process are unintentional.

## 2020-11-12 ENCOUNTER — Ambulatory Visit: Payer: 59 | Admitting: Family Medicine

## 2020-11-12 ENCOUNTER — Encounter: Payer: Self-pay | Admitting: Family Medicine

## 2020-11-12 ENCOUNTER — Other Ambulatory Visit: Payer: Self-pay

## 2020-11-12 VITALS — BP 102/72 | HR 64 | Ht 64.0 in | Wt 148.0 lb

## 2020-11-12 DIAGNOSIS — M9903 Segmental and somatic dysfunction of lumbar region: Secondary | ICD-10-CM

## 2020-11-12 DIAGNOSIS — M9902 Segmental and somatic dysfunction of thoracic region: Secondary | ICD-10-CM | POA: Diagnosis not present

## 2020-11-12 DIAGNOSIS — G8929 Other chronic pain: Secondary | ICD-10-CM

## 2020-11-12 DIAGNOSIS — M545 Low back pain, unspecified: Secondary | ICD-10-CM

## 2020-11-12 DIAGNOSIS — M9904 Segmental and somatic dysfunction of sacral region: Secondary | ICD-10-CM | POA: Diagnosis not present

## 2020-11-12 NOTE — Patient Instructions (Signed)
Hip abduction strength See me in 6 weeks

## 2020-11-12 NOTE — Assessment & Plan Note (Signed)
Patient's low back still seems to be a chronic problem with mild exacerbation.  Patient does have meloxicam for breakthrough pain if necessary.  Patient will be traveling and going to the knees in part that could cause some exacerbation.  Discussed hip abductor strengthening benefit will be beneficial.  Increase activity slowly.  Follow-up with me again 6 weeks

## 2020-11-16 ENCOUNTER — Other Ambulatory Visit: Payer: Self-pay

## 2020-11-16 MED FILL — Estradiol TD Patch Twice Weekly 0.05 MG/24HR: TRANSDERMAL | 28 days supply | Qty: 8 | Fill #0 | Status: AC

## 2020-11-20 DIAGNOSIS — E559 Vitamin D deficiency, unspecified: Secondary | ICD-10-CM | POA: Diagnosis not present

## 2020-11-20 DIAGNOSIS — E282 Polycystic ovarian syndrome: Secondary | ICD-10-CM | POA: Diagnosis not present

## 2020-11-20 DIAGNOSIS — E063 Autoimmune thyroiditis: Secondary | ICD-10-CM | POA: Diagnosis not present

## 2020-11-24 ENCOUNTER — Other Ambulatory Visit: Payer: Self-pay

## 2020-11-24 MED ORDER — ESTRADIOL 0.0375 MG/24HR TD PTTW
MEDICATED_PATCH | TRANSDERMAL | 5 refills | Status: AC
  Filled 2020-11-24 – 2020-12-18 (×2): qty 8, 28d supply, fill #0
  Filled 2021-01-14: qty 8, 28d supply, fill #1
  Filled 2021-02-08: qty 8, 28d supply, fill #2
  Filled 2021-03-09: qty 8, 28d supply, fill #3
  Filled 2021-04-07: qty 8, 28d supply, fill #4
  Filled 2021-05-04: qty 8, 28d supply, fill #5

## 2020-12-08 ENCOUNTER — Other Ambulatory Visit: Payer: Self-pay

## 2020-12-18 ENCOUNTER — Other Ambulatory Visit: Payer: Self-pay

## 2020-12-25 NOTE — Progress Notes (Signed)
[facility] [address] [place] [address] Phone: [phone] Subjective:   Stacey Parker, am serving as a scribe for Dr. Antoine Primas. This visit occurred during the SARS-CoV-2 public health emergency.  Safety protocols were in place, including screening questions prior to the visit, additional usage of staff PPE, and extensive cleaning of exam room while observing appropriate contact time as indicated for disinfecting solutions.   I'm seeing this patient by the request  of:  Patient, No Pcp Per (Inactive)  CC: back pain follow up   [id number]  Stacey Parker is a 50 y.o. female coming in with complaint of back and neck pain. OMT 11/12/2020. Patient states that the adjustment last visit did help to alleviate her acute pain. Is back to playing tennis.  Patient still notices more neck pain.  Has done dry needling in the past for it that have been helpful.  States that it seems to be more on the posterior of the right shoulder.  He denies any weakness.  He states that the elbow seems to be doing relatively well.  Medications patient has been prescribed: Zanaflex          Reviewed prior external information including notes and imaging from previsou exam, outside providers and external EMR if available.   As well as notes that were available from care everywhere and other healthcare systems.  Past medical history, social, surgical and family history all reviewed in electronic medical record.  No pertanent information unless stated regarding to the chief complaint.   Past Medical History:  Diagnosis Date  . Hashimoto's thyroiditis   . Polycystic ovary     Allergies  Allergen Reactions  . Sulfa Antibiotics Other (See Comments)     Review of Systems:  No headache, visual changes, nausea, vomiting, diarrhea, constipation, dizziness, abdominal pain, skin rash, fevers, chills, night sweats, weight loss, swollen lymph nodes, body aches,  joint swelling, chest pain, shortness of breath, mood changes. POSITIVE muscle aches  Objective  Blood pressure 100/68, pulse (!) 55, height 5\' 4"  (1.626 m), weight 145 lb (65.8 kg), SpO2 97 %.   General: No apparent distress alert and oriented x3 mood and affect normal, dressed appropriately.  HEENT: Pupils equal, extraocular movements intact  Respiratory: Patient's speak in full sentences and does not appear short of breath  Cardiovascular: No lower extremity edema, non tender, no erythema  Gait normal with good balance and coordination.  MSK: Very minimal pain at the elbow on the right side. Back -tightness noted in the parascapular region right greater than left. . Hamstring and quadriceps strength is normal.  Patient also has tightness of the right side of the neck.  Negative Spurling sign.  Osteopathic findings C6 flexed rotated and side bent right T5 extended rotated and side bent right inhaled rib T9 extended rotated and side bent left L1 flexed rotated and side bent right Sacrum right on right       Assessment and Plan:  Cervical pain (neck) Continues to have cervical neck pain.  Discussed with patient about the possibility of advanced imaging we will consider getting x-rays at next follow-up.  Patient continues to be very active.  Continues to increase activity.  Playing tennis regularly.  Continue to work on .  Follow-up again in 6 to 8 weeks.  Significant worsening pain do think advanced imaging would also be possible necessary.  Low back pain Chronic problem secondary to tightness of the hip flexors.  We discussed ergonomics that could be beneficial.  Which activities to doing which wants to avoid.  Increase activity slowly.  Discussed icing regimen.  Follow-up again in 6 to 8 weeks    Nonallopathic problems  Decision today to treat with OMT was based on Physical Exam  After verbal consent patient was treated with HVLA, ME, FPR techniques in  cervical, rib, thoracic, lumbar, and sacral  areas  Patient tolerated the procedure well with improvement in symptoms  Patient given exercises, stretches and lifestyle modifications  See medications in patient instructions if given  Patient will follow up in 4-8 weeks      The above documentation has been reviewed and is accurate and complete Judi Saa, DO       Note: This dictation was prepared with Dragon dictation along with smaller phrase technology. Any transcriptional errors that result from this process are unintentional.

## 2020-12-29 ENCOUNTER — Encounter: Payer: Self-pay | Admitting: Family Medicine

## 2020-12-29 ENCOUNTER — Other Ambulatory Visit: Payer: Self-pay

## 2020-12-29 ENCOUNTER — Ambulatory Visit: Payer: 59 | Admitting: Family Medicine

## 2020-12-29 VITALS — BP 100/68 | HR 55 | Ht 64.0 in | Wt 145.0 lb

## 2020-12-29 DIAGNOSIS — M9904 Segmental and somatic dysfunction of sacral region: Secondary | ICD-10-CM | POA: Diagnosis not present

## 2020-12-29 DIAGNOSIS — M9901 Segmental and somatic dysfunction of cervical region: Secondary | ICD-10-CM | POA: Diagnosis not present

## 2020-12-29 DIAGNOSIS — G8929 Other chronic pain: Secondary | ICD-10-CM | POA: Diagnosis not present

## 2020-12-29 DIAGNOSIS — M545 Low back pain, unspecified: Secondary | ICD-10-CM

## 2020-12-29 DIAGNOSIS — M9908 Segmental and somatic dysfunction of rib cage: Secondary | ICD-10-CM | POA: Diagnosis not present

## 2020-12-29 DIAGNOSIS — M9903 Segmental and somatic dysfunction of lumbar region: Secondary | ICD-10-CM

## 2020-12-29 DIAGNOSIS — M9902 Segmental and somatic dysfunction of thoracic region: Secondary | ICD-10-CM

## 2020-12-29 DIAGNOSIS — M542 Cervicalgia: Secondary | ICD-10-CM

## 2020-12-29 NOTE — Assessment & Plan Note (Signed)
Chronic problem secondary to tightness of the hip flexors.  We discussed ergonomics that could be beneficial.  Which activities to doing which wants to avoid.  Increase activity slowly.  Discussed icing regimen.  Follow-up again in 6 to 8 weeks

## 2020-12-29 NOTE — Assessment & Plan Note (Signed)
Continues to have cervical neck pain.  Discussed with patient about the possibility of advanced imaging we will consider getting x-rays at next follow-up.  Patient continues to be very active.  Continues to increase activity.  Playing tennis regularly.  Continue to work on [employer].  Follow-up again in 6 to 8 weeks.  Significant worsening pain do think advanced imaging would also be possible necessary.

## 2020-12-29 NOTE — Patient Instructions (Signed)
Lower back is better Keep working on hip flexors Keep R shoulder back  See me in 6-8 weeks

## 2021-01-09 IMAGING — MG DIGITAL SCREENING BILAT W/ TOMO W/ CAD
8 series · 8 of 24 positions shown · non-contrast
Comparison: Previous exam(s).

CLINICAL DATA: Screening.

EXAM:
DIGITAL SCREENING BILATERAL MAMMOGRAM WITH TOMO AND CAD

[R CC synth-2D]
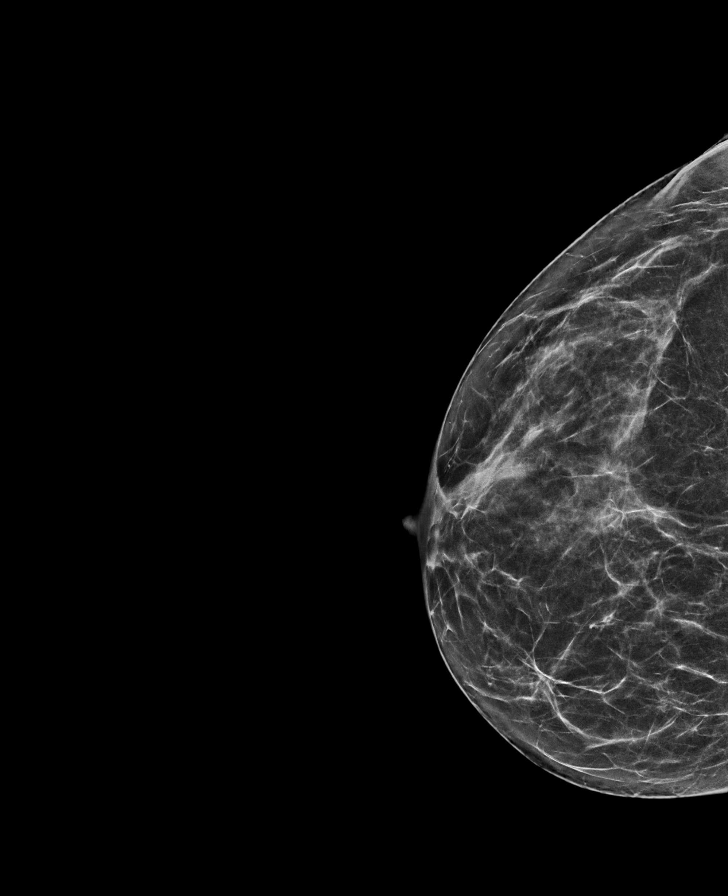

[L CC synth-2D]
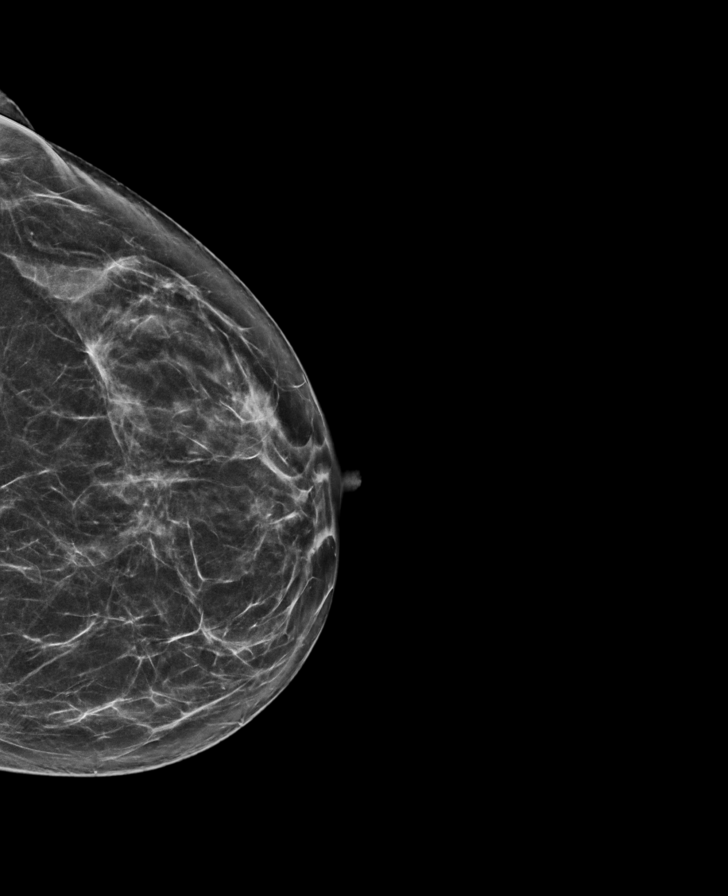

[R MLO synth-2D]
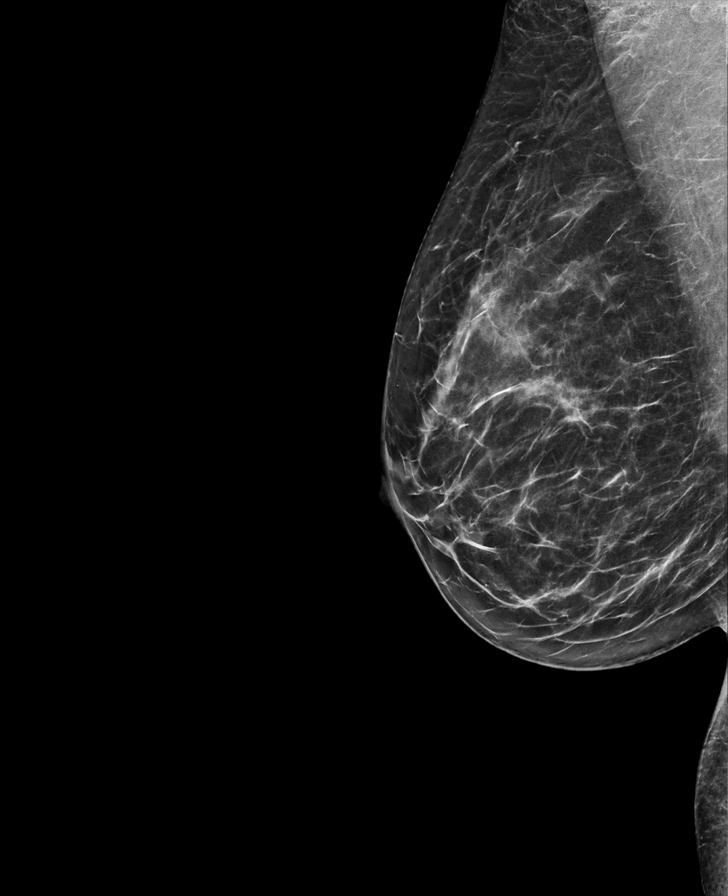

[L MLO synth-2D]
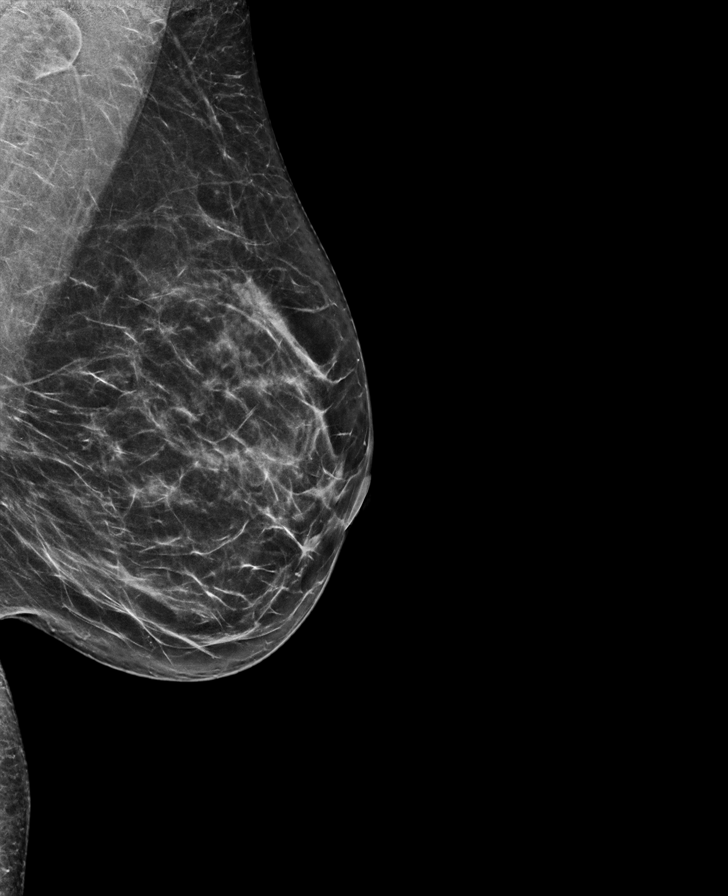

[R CC tomo · tomo slice 33/64.0]
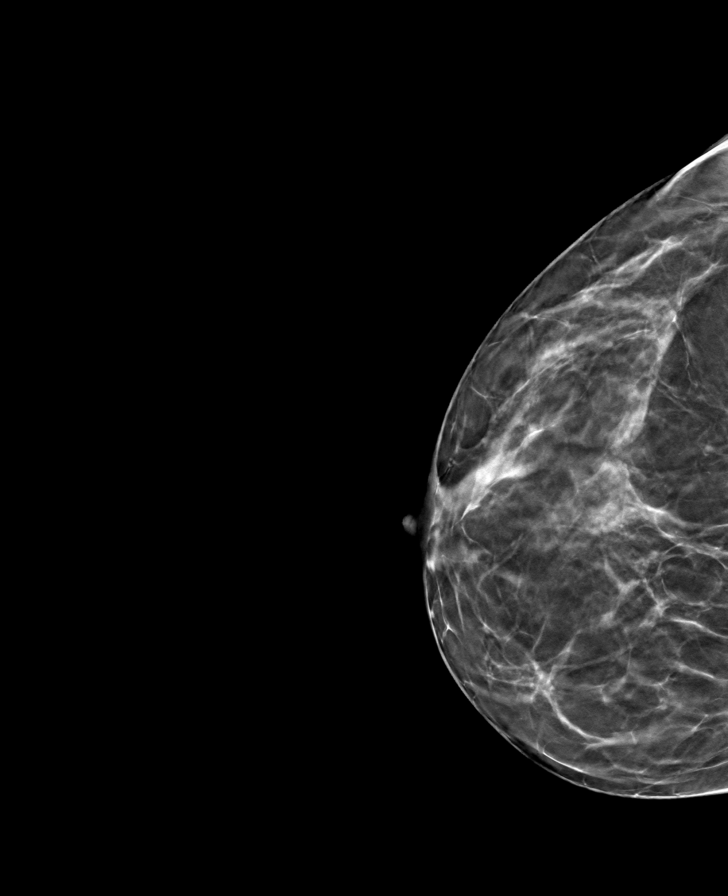

[R MLO tomo · tomo slice 35/69.0]
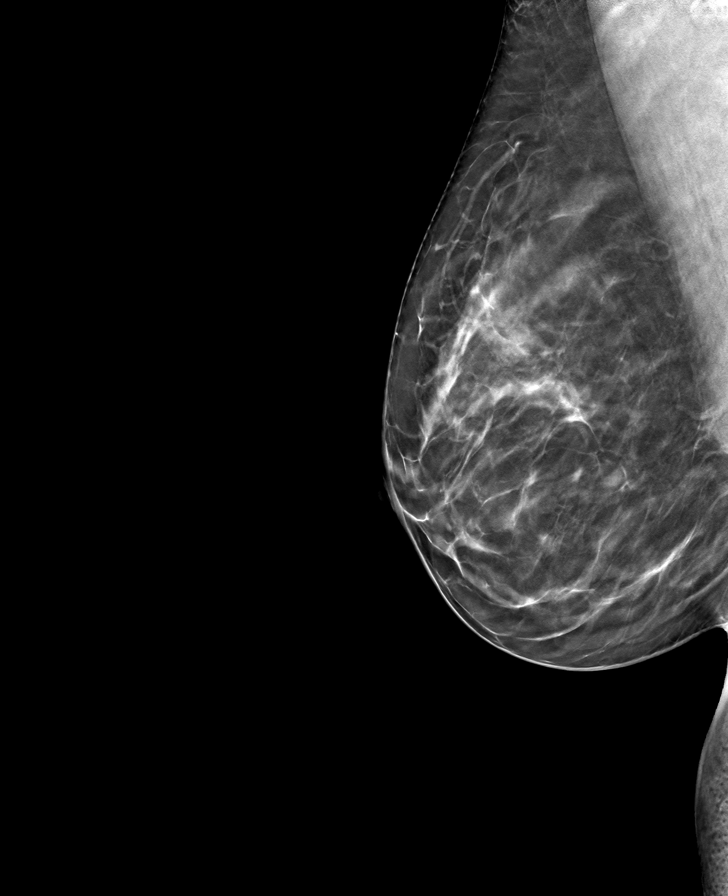

[L MLO tomo · tomo slice 37/72.0]
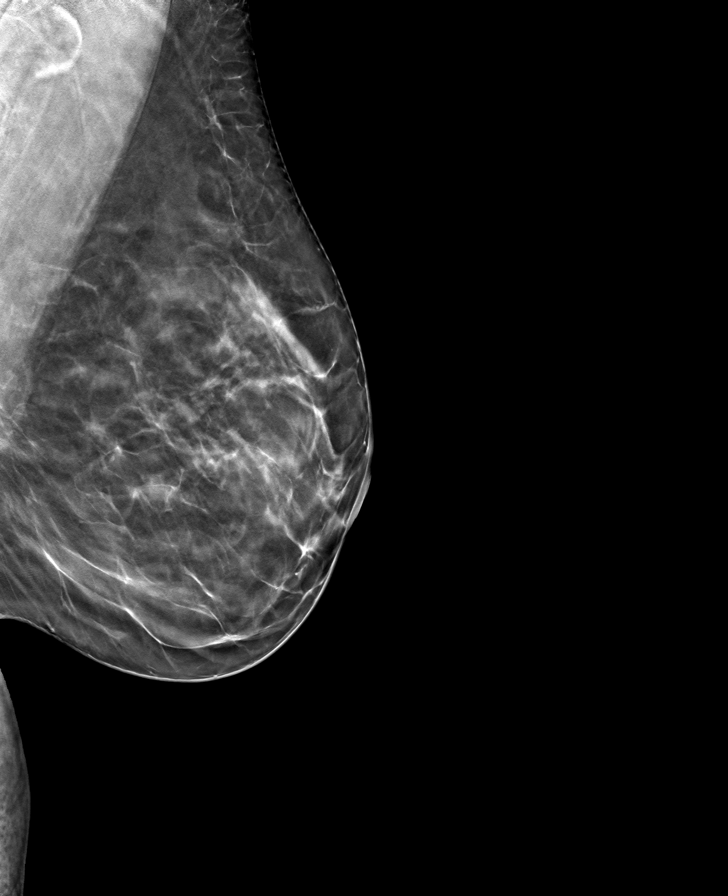

[L CC tomo · tomo slice 34/67.0]
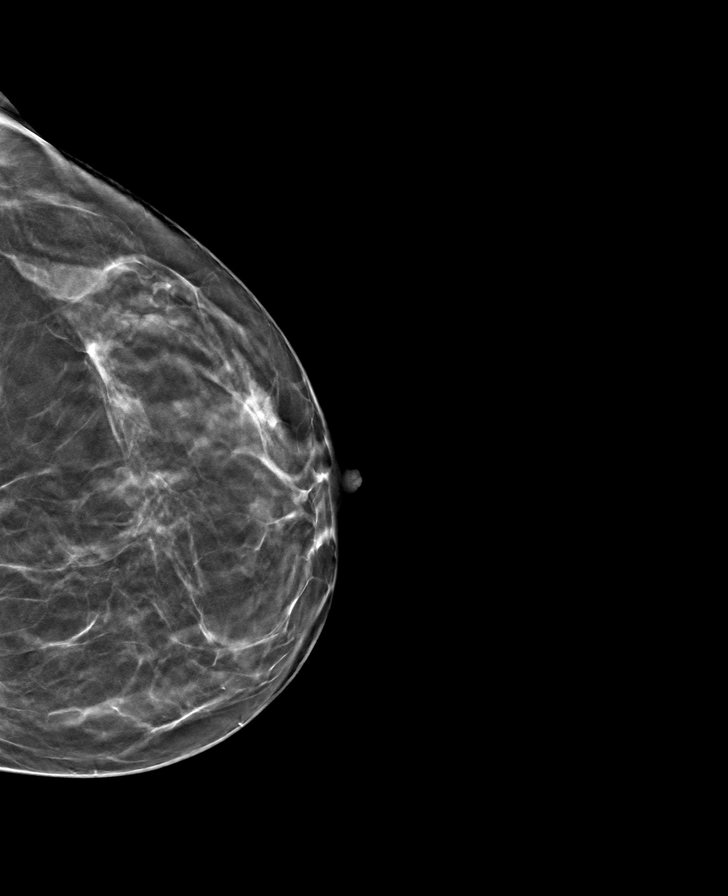

[8 of 24 positions shown; findings below may reference images not displayed]

ACR Breast Density Category b: There are scattered areas of
fibroglandular density.
FINDINGS: There are no findings suspicious for malignancy. Images were
processed with CAD.
IMPRESSION: No mammographic evidence of malignancy. A result letter of this
screening mammogram will be mailed directly to the patient.

RECOMMENDATION:
Screening mammogram in one year. (Code:[id number])

BI-RADS CATEGORY  1: Negative.

## 2021-01-14 ENCOUNTER — Other Ambulatory Visit: Payer: Self-pay

## 2021-01-14 MED FILL — Levothyroxine Sodium Tab 50 MCG: ORAL | 90 days supply | Qty: 90 | Fill #0 | Status: AC

## 2021-01-19 DIAGNOSIS — E063 Autoimmune thyroiditis: Secondary | ICD-10-CM | POA: Diagnosis not present

## 2021-01-19 DIAGNOSIS — N951 Menopausal and female climacteric states: Secondary | ICD-10-CM | POA: Diagnosis not present

## 2021-01-19 DIAGNOSIS — E282 Polycystic ovarian syndrome: Secondary | ICD-10-CM | POA: Diagnosis not present

## 2021-01-28 ENCOUNTER — Encounter: Payer: Self-pay | Admitting: Family Medicine

## 2021-02-08 ENCOUNTER — Other Ambulatory Visit: Payer: Self-pay

## 2021-02-09 NOTE — Progress Notes (Signed)
[name] [address] [place] [address] Phone: [phone] Subjective:   Stacey Parker, am serving as a scribe for Dr. Antoine Primas.  This visit occurred during the SARS-CoV-2 public health emergency.  Safety protocols were in place, including screening questions prior to the visit, additional usage of staff PPE, and extensive cleaning of exam room while observing appropriate contact time as indicated for disinfecting solutions.    CC: back and neck pain   DVV:[id number]  Stacey Parker is a 50 y.o. female coming in with complaint of back and neck pain. OMT 12/29/2020. Patient states that she continues to have neck pain. Patient wonders if her neck pain is from her pillow. Pain occurring often times in the morning.   Continued pain in R lateral epicondyle. Is using bigger grip (2 from 0). Racquet is lighter.   Medications patient has been prescribed:        Reviewed prior external information including notes and imaging from previsou exam, outside providers and external EMR if available.   As well as notes that were available from care everywhere and other healthcare systems.  Past medical history, social, surgical and family history all reviewed in electronic medical record.  No pertanent information unless stated regarding to the chief complaint.   Past Medical History:  Diagnosis Date   Hashimoto's thyroiditis    Polycystic ovary     Allergies  Allergen Reactions   Sulfa Antibiotics Other (See Comments)     Review of Systems:  No headache, visual changes, nausea, vomiting, diarrhea, constipation, dizziness, abdominal pain, skin rash, fevers, chills, night sweats, weight loss, swollen lymph nodes, body aches, joint swelling, chest pain, shortness of breath, mood changes. POSITIVE muscle aches  Objective  Blood pressure 110/72, pulse 69, height 5\' 4"  (1.626 m), weight 144 lb (65.3 kg), SpO2 98 %.   General: No apparent  distress alert and oriented x3 mood and affect normal, dressed appropriately.  HEENT: Pupils equal, extraocular movements intact  Respiratory: Patient's speak in full sentences and does not appear short of breath  Cardiovascular: No lower extremity edema, non tender, no erythema  Location right elbow very minimal tenderness to palpation of the lateral epicondylar region.  No worsening pain with extension of the wrist against resistance.  Good grip strength noted. Lower back does have some mild tightness noted.  Some mild tightness in the parascapular region as well.  Neck does have some limited sidebending bilaterally.  Osteopathic findings  C2 flexed rotated and side bent right C6 flexed rotated and side bent left T3 extended rotated and side bent right inhaled rib T9 extended rotated and side bent left L2 flexed rotated and side bent right Sacrum right on right       Assessment and Plan:  Lateral epicondylitis of right elbow Stable overall.  Discussed icing regimen and home exercises, discussed which activities to do which wants to avoid.  Increase activity slowly.  Discussed using the topical anti-inflammatories.  Discussed changes of the ergonomics at work that could likely be more beneficial.  Follow-up with me again 6 weeks   Nonallopathic problems  Decision today to treat with OMT was based on Physical Exam  After verbal consent patient was treated with HVLA, ME, FPR techniques in cervical, rib, thoracic, lumbar, and sacral  areas  Patient tolerated the procedure well with improvement in symptoms  Patient given exercises, stretches and lifestyle modifications  See medications in patient instructions if given  Patient will follow  up in 4-8 weeks     The above documentation has been reviewed and is accurate and complete Judi Saa, DO        Note: This dictation was prepared with Dragon dictation along with smaller phrase technology. Any transcriptional errors  that result from this process are unintentional.

## 2021-02-11 DIAGNOSIS — Z20822 Contact with and (suspected) exposure to covid-19: Secondary | ICD-10-CM | POA: Diagnosis not present

## 2021-02-12 ENCOUNTER — Ambulatory Visit: Payer: 59 | Admitting: Family Medicine

## 2021-02-12 ENCOUNTER — Other Ambulatory Visit: Payer: Self-pay

## 2021-02-12 ENCOUNTER — Encounter: Payer: Self-pay | Admitting: Family Medicine

## 2021-02-12 VITALS — BP 110/72 | HR 69 | Ht 64.0 in | Wt 144.0 lb

## 2021-02-12 DIAGNOSIS — M7711 Lateral epicondylitis, right elbow: Secondary | ICD-10-CM | POA: Diagnosis not present

## 2021-02-12 DIAGNOSIS — M9902 Segmental and somatic dysfunction of thoracic region: Secondary | ICD-10-CM | POA: Diagnosis not present

## 2021-02-12 DIAGNOSIS — M9904 Segmental and somatic dysfunction of sacral region: Secondary | ICD-10-CM | POA: Diagnosis not present

## 2021-02-12 DIAGNOSIS — M9901 Segmental and somatic dysfunction of cervical region: Secondary | ICD-10-CM | POA: Diagnosis not present

## 2021-02-12 DIAGNOSIS — M542 Cervicalgia: Secondary | ICD-10-CM | POA: Diagnosis not present

## 2021-02-12 DIAGNOSIS — M9908 Segmental and somatic dysfunction of rib cage: Secondary | ICD-10-CM

## 2021-02-12 DIAGNOSIS — M9903 Segmental and somatic dysfunction of lumbar region: Secondary | ICD-10-CM

## 2021-02-12 MED ORDER — CARESTART COVID-19 HOME TEST VI KIT
PACK | 0 refills | Status: AC
  Filled 2021-02-12: qty 2, 4d supply, fill #0

## 2021-02-12 NOTE — Patient Instructions (Signed)
COOP pillow See how racquet does See me in 6-8 weeks

## 2021-02-12 NOTE — Assessment & Plan Note (Signed)
Stable overall.  Discussed icing regimen and home exercises, discussed which activities to do which wants to avoid.  Increase activity slowly.  Discussed using the topical anti-inflammatories.  Discussed changes of the ergonomics at work that could likely be more beneficial.  Follow-up with me again 6 weeks

## 2021-02-12 NOTE — Assessment & Plan Note (Signed)
Chronic problem that likely secondary to more posture and ergonomics.  Discussed again about the meloxicam and using it if necessary.  Discussed with home exercises.  Follow-up with me again in 6 to 8 weeks

## 2021-03-09 ENCOUNTER — Other Ambulatory Visit: Payer: Self-pay

## 2021-03-30 ENCOUNTER — Ambulatory Visit: Payer: 59 | Admitting: Family Medicine

## 2021-03-30 ENCOUNTER — Ambulatory Visit: Payer: Self-pay

## 2021-03-30 ENCOUNTER — Other Ambulatory Visit: Payer: Self-pay

## 2021-03-30 VITALS — BP 110/68 | HR 61 | Ht 64.0 in | Wt 147.0 lb

## 2021-03-30 DIAGNOSIS — M9904 Segmental and somatic dysfunction of sacral region: Secondary | ICD-10-CM | POA: Diagnosis not present

## 2021-03-30 DIAGNOSIS — M7711 Lateral epicondylitis, right elbow: Secondary | ICD-10-CM

## 2021-03-30 DIAGNOSIS — G8929 Other chronic pain: Secondary | ICD-10-CM | POA: Diagnosis not present

## 2021-03-30 DIAGNOSIS — M545 Low back pain, unspecified: Secondary | ICD-10-CM | POA: Diagnosis not present

## 2021-03-30 DIAGNOSIS — M9902 Segmental and somatic dysfunction of thoracic region: Secondary | ICD-10-CM | POA: Diagnosis not present

## 2021-03-30 NOTE — Assessment & Plan Note (Signed)
Will monitor. No need for further imaging at this point.  Patient will increase activity and see how patient responds.  If worsening pain possible advanced imaging would be warranted.

## 2021-03-30 NOTE — Patient Instructions (Signed)
You are awesome.  Ke[place] doing what you are doing.  I am here for you if you need me Don't worry as much of the elbow  See me again in 6-8 weeks otherwise

## 2021-03-30 NOTE — Assessment & Plan Note (Signed)
Mild tightness noted.  Discussed posture and ergonomics, which activities to do which wants to avoid.  Increase activity slowly.  Patient responding well to manipulation.  Follow-up again in 6 to 8 weeks discussed the importance of stretching after physical activity.

## 2021-03-30 NOTE — Progress Notes (Signed)
[name] [address] [place] [address] Phone: [phone] Subjective:   Stacey Parker, am serving as a scribe for Dr. Antoine Parker. This visit occurred during the SARS-CoV-2 public health emergency.  Safety protocols were in place, including screening questions prior to the visit, additional usage of staff PPE, and extensive cleaning of exam room while observing appropriate contact time as indicated for disinfecting solutions.   I'm seeing this patient by the request  of:  Patient, No Pcp Per (Inactive)  CC: back pain and elbow pain   [name]  Stacey Parker is a 50 y.o. female coming in with complaint of back and neck pain. OMT 02/12/2021. L lateral epi f/u. Patient states back feels better, has been doing stretching. Has new racket and elbow has been getting better. Still can play pain is barable. Feels after she plays tennis  Medications patient has been prescribed: Zanaflex  Taking:         Reviewed prior external information including notes and imaging from previsou exam, outside providers and external EMR if available.   As well as notes that were available from care everywhere and other healthcare systems.  Past medical history, social, surgical and family history all reviewed in electronic medical record.  No pertanent information unless stated regarding to the chief complaint.   Past Medical History:  Diagnosis Date   Hashimoto's thyroiditis    Polycystic ovary     Allergies  Allergen Reactions   Sulfa Antibiotics Other (See Comments)     Review of Systems:  No headache, visual changes, nausea, vomiting, diarrhea, constipation, dizziness, abdominal pain, skin rash, fevers, chills, night sweats, weight loss, swollen lymph nodes, body aches, joint swelling, chest pain, shortness of breath, mood changes. POSITIVE muscle aches  Objective  Blood pressure 110/68, pulse 61, height 5\' 4"  (1.626 m), weight 147 lb  (66.7 kg), SpO2 97 %.   General: No apparent distress alert and oriented x3 mood and affect normal, dressed appropriately.  HEENT: Pupils equal, extraocular movements intact  Respiratory: Patient's speak in full sentences and does not appear short of breath  Cardiovascular: No lower extremity edema, non tender, no erythema  Elbow exam shows-patient minorly tender over the lateral epicondylar region.  The patient now has good grip strength. Back exam shows-low back does have some tightness noted.  Some tenderness to palpation of the paraspinal musculature.  Seems to be more in the lumbar spine at the moment.  Right greater than left.  Negative straight leg test but does have tightness with  Osteopathic findings  C2 flexed rotated and side bent right C6 flexed rotated and side bent left T3 extended rotated and side bent right inhaled rib T9 extended rotated and side bent left L2 flexed rotated and side bent right Sacrum right on right       Assessment and Plan:  Low back pain Mild tightness noted.  Discussed posture and ergonomics, which activities to do which wants to avoid.  Increase activity slowly.  Patient responding well to manipulation.  Follow-up again in 6 to 8 weeks discussed the importance of stretching after physical activity.  Lateral epicondylitis of right elbow Will monitor. No need for further imaging at this point.  Patient will increase activity and see how patient responds.  If worsening pain possible advanced imaging would be warranted.   Nonallopathic problems  Decision today to treat with OMT was based on Physical Exam  After verbal consent patient was treated with  HVLA, ME techniques in cervical,  thoracic, lumbar, and sacral  areas  Patient tolerated the procedure well with improvement in symptoms  Patient given exercises, stretches and lifestyle modifications  See medications in patient instructions if given  Patient will follow up in 4-8  weeks     The above documentation has been reviewed and is accurate and complete Stacey Saa, DO        Note: This dictation was prepared with Dragon dictation along with smaller phrase technology. Any transcriptional errors that result from this process are unintentional.

## 2021-04-07 ENCOUNTER — Other Ambulatory Visit: Payer: Self-pay

## 2021-04-08 ENCOUNTER — Other Ambulatory Visit: Payer: Self-pay

## 2021-04-08 MED ORDER — LEVOTHYROXINE SODIUM 50 MCG PO TABS
ORAL_TABLET | ORAL | 1 refills | Status: DC
  Filled 2021-04-08: qty 90, 90d supply, fill #0
  Filled 2021-07-20: qty 90, 90d supply, fill #1

## 2021-05-04 ENCOUNTER — Other Ambulatory Visit: Payer: Self-pay

## 2021-05-10 NOTE — Progress Notes (Signed)
[name] [address] [place] [address] Phone: [phone] Subjective:   [name], am serving as a scribe for Dr. Antoine Primas. This visit occurred during the SARS-CoV-2 public health emergency.  Safety protocols were in place, including screening questions prior to the visit, additional usage of staff PPE, and extensive cleaning of exam room while observing appropriate contact time as indicated for disinfecting solutions.   I'm seeing this patient by the request  of:  Patient, No Pcp Per (Inactive)  CC: Elbow pain follow-up, neck and low back pain follow-up  [id number]  Stacey Parker is a 50 y.o. female coming in with complaint of back and neck pain. OMT on 03/30/2021. Follow up for elbow. Will monitor. No need for further imaging at this point.  Patient will increase activity and see how patient responds.  If worsening pain possible advanced imaging would be warranted. Patient states that she played this morning and is using a tighter sleeve. No new pain in hips or lower back since last visit.   Medications patient has been prescribed:   Taking:        Past Medical History:  Diagnosis Date   Hashimoto's thyroiditis    Polycystic ovary     Allergies  Allergen Reactions   Sulfa Antibiotics Other (See Comments)     Review of Systems:  No headache, visual changes, nausea, vomiting, diarrhea, constipation, dizziness, abdominal pain, skin rash, fevers, chills, night sweats, weight loss, swollen lymph nodes, body aches, joint swelling, chest pain, shortness of breath, mood changes. POSITIVE muscle aches  Objective  Blood pressure 100/76, pulse 78, height 5\' 4"  (1.626 m), weight 149 lb (67.6 kg), SpO2 97 %.   General: No apparent distress alert and oriented x3 mood and affect normal, dressed appropriately.  HEENT: Pupils equal, extraocular movements intact  Respiratory: Patient's speak in full sentences and does not  appear short of breath  Cardiovascular: No lower extremity edema, non tender, no erythema  Right elbow exam still shows some pain in the lateral epicondylar region.  Tender to palpation in the area.  Patient does have less pain noted with resisted extension of the wrist.  No significant swelling noted.  Limited muscular skeletal ultrasound was performed and interpreted by , M  Limited musculoskeletal ultrasound of patient's right elbow does show the patient does have some mild increase in Doppler flow right on the olecranon bone itself.  Osteopathic findings  C5 flexed rotated and side bent right T3 extended rotated and side bent right inhaled rib T8 extended rotated and side bent left L2 flexed rotated and side bent right Sacrum right on right       Assessment and Plan:  Lateral epicondylitis of right elbow Patient is mild discomfort noted most distal veins around the olecranon.  Discussed with patient to avoid direct impact in the area if possible.  Is able to increase activity as tolerated.  Discussed icing regimen and home exercises.  Follow-up with me again in 6 to 8 weeks.  Low back pain Low back pain is a chronic thing.  Stable overall.  Discussed that with patient to continue to work on home exercises.  Has the muscle relaxers for breakthrough.  Increase activity slowly.  Follow-up again in 6 to 8 weeks   Nonallopathic problems  Decision today to treat with OMT was based on Physical Exam  After verbal consent patient was treated with HVLA, ME, FPR techniques in cervical, rib, thoracic, lumbar, and  sacral  areas  Patient tolerated the procedure well with improvement in symptoms  Patient given exercises, stretches and lifestyle modifications  See medications in patient instructions if given  Patient will follow up in 4-8 weeks      The above documentation has been reviewed and is accurate and complete Judi Saa, DO       Note: This dictation  was prepared with Dragon dictation along with smaller phrase technology. Any transcriptional errors that result from this process are unintentional.

## 2021-05-11 ENCOUNTER — Other Ambulatory Visit: Payer: Self-pay

## 2021-05-11 ENCOUNTER — Ambulatory Visit: Payer: 59 | Admitting: Family Medicine

## 2021-05-11 ENCOUNTER — Ambulatory Visit: Payer: Self-pay

## 2021-05-11 VITALS — BP 100/76 | HR 78 | Ht 64.0 in | Wt 149.0 lb

## 2021-05-11 DIAGNOSIS — M9903 Segmental and somatic dysfunction of lumbar region: Secondary | ICD-10-CM

## 2021-05-11 DIAGNOSIS — M545 Low back pain, unspecified: Secondary | ICD-10-CM

## 2021-05-11 DIAGNOSIS — M9902 Segmental and somatic dysfunction of thoracic region: Secondary | ICD-10-CM | POA: Diagnosis not present

## 2021-05-11 DIAGNOSIS — G8929 Other chronic pain: Secondary | ICD-10-CM

## 2021-05-11 DIAGNOSIS — M9908 Segmental and somatic dysfunction of rib cage: Secondary | ICD-10-CM | POA: Diagnosis not present

## 2021-05-11 DIAGNOSIS — M9901 Segmental and somatic dysfunction of cervical region: Secondary | ICD-10-CM

## 2021-05-11 DIAGNOSIS — M7711 Lateral epicondylitis, right elbow: Secondary | ICD-10-CM

## 2021-05-11 DIAGNOSIS — M9904 Segmental and somatic dysfunction of sacral region: Secondary | ICD-10-CM

## 2021-05-11 NOTE — Assessment & Plan Note (Signed)
Low back pain is a chronic thing.  Stable overall.  Discussed that with patient to continue to work on home exercises.  Has the muscle relaxers for breakthrough.  Increase activity slowly.  Follow-up again in 6 to 8 weeks

## 2021-05-11 NOTE — Assessment & Plan Note (Signed)
Patient is mild discomfort noted most distal veins around the olecranon.  Discussed with patient to avoid direct impact in the area if possible.  Is able to increase activity as tolerated.  Discussed icing regimen and home exercises.  Follow-up with me again in 6 to 8 weeks.

## 2021-05-11 NOTE — Patient Instructions (Signed)
Stop resting elbow on things See me in 8 weeks

## 2021-05-31 DIAGNOSIS — N951 Menopausal and female climacteric states: Secondary | ICD-10-CM | POA: Diagnosis not present

## 2021-05-31 DIAGNOSIS — E282 Polycystic ovarian syndrome: Secondary | ICD-10-CM | POA: Diagnosis not present

## 2021-05-31 DIAGNOSIS — E063 Autoimmune thyroiditis: Secondary | ICD-10-CM | POA: Diagnosis not present

## 2021-06-02 ENCOUNTER — Other Ambulatory Visit: Payer: Self-pay

## 2021-06-03 ENCOUNTER — Other Ambulatory Visit: Payer: Self-pay

## 2021-06-03 MED ORDER — ESTRADIOL 0.0375 MG/24HR TD PTTW
MEDICATED_PATCH | TRANSDERMAL | 1 refills | Status: AC
  Filled 2021-06-03: qty 24, 84d supply, fill #0
  Filled 2021-08-17: qty 24, 84d supply, fill #1

## 2021-06-04 ENCOUNTER — Other Ambulatory Visit: Payer: Self-pay

## 2021-07-07 NOTE — Progress Notes (Signed)
[name] [address] [place] [address] Phone: [phone] Subjective:   Stacey Parker, am serving as a scribe for Dr. Antoine Primas. This visit occurred during the SARS-CoV-2 public health emergency.  Safety protocols were in place, including screening questions prior to the visit, additional usage of staff PPE, and extensive cleaning of exam room while observing appropriate contact time as indicated for disinfecting solutions.   I'm seeing this patient by the request  of:  Patient, No Pcp Per (Inactive)  CC: Elbow pain follow-up, hip and back pain follow-up  [id number]  Stacey Parker is a 50 y.o. female coming in with complaint of back and neck pain. OMT 05/11/2021. Also f/u for R elbow pain. Patient states back and hips painful. Right elbow feels in forearm.  Patient states that she is able to play tennis fairly regularly though.  Nothing that is stopping her from activity.  Medications patient has been prescribed: Zanaflex  Taking:         Reviewed prior external information including notes and imaging from previsou exam, outside providers and external EMR if available.   As well as notes that were available from care everywhere and other healthcare systems.  Past medical history, social, surgical and family history all reviewed in electronic medical record.  No pertanent information unless stated regarding to the chief complaint.   Past Medical History:  Diagnosis Date   Hashimoto's thyroiditis    Polycystic ovary     Allergies  Allergen Reactions   Sulfa Antibiotics Other (See Comments)     Review of Systems:  No headache, visual changes, nausea, vomiting, diarrhea, constipation, dizziness, abdominal pain, skin rash, fevers, chills, night sweats, weight loss, swollen lymph nodes, body aches, joint swelling, chest pain, shortness of breath, mood changes. POSITIVE muscle aches  Objective  Blood pressure 100/60,  pulse 71, height 5\' 4"  (1.626 m), weight 150 lb (68 kg), SpO2 98 %.   General: No apparent distress alert and oriented x3 mood and affect normal, dressed appropriately.  HEENT: Pupils equal, extraocular movements intact  Respiratory: Patient's speak in full sentences and does not appear short of breath  Cardiovascular: No lower extremity edema, non tender, no erythema  Low back exam does have some mild loss of lordosis.  Patient does have tenderness noted in the right sacroiliac joint. Right elbow exam shows no significant weakness noted.   Osteopathic findings   T9 extended rotated and side bent left L2 flexed rotated and side bent right Sacrum right on right       Assessment and Plan:  Low back pain Chronic, with mild exacerbation.  Responding well though to osteopathic manipulation.  Has Zanaflex and the meloxicam.  He tries to not use them very regularly though.  Follow-up with me again in 2 months  Lateral epicondylitis of right elbow Improvement noted again.  We will continue to monitor but patient is having very minimal pain.   Nonallopathic problems  Decision today to treat with OMT was based on Physical Exam  After verbal consent patient was treated with HVLA, ME, FPR techniques in  thoracic, lumbar, and sacral  areas  Patient tolerated the procedure well with improvement in symptoms  Patient given exercises, stretches and lifestyle modifications  See medications in patient instructions if given  Patient will follow up in 4-8 weeks      The above documentation has been reviewed and is accurate and complete , DO  Note: This dictation was prepared with Dragon dictation along with smaller phrase technology. Any transcriptional errors that result from this process are unintentional.

## 2021-07-08 ENCOUNTER — Other Ambulatory Visit: Payer: Self-pay

## 2021-07-08 ENCOUNTER — Encounter: Payer: Self-pay | Admitting: Family Medicine

## 2021-07-08 ENCOUNTER — Ambulatory Visit: Payer: 59 | Admitting: Family Medicine

## 2021-07-08 VITALS — BP 100/60 | HR 71 | Ht 64.0 in | Wt 150.0 lb

## 2021-07-08 DIAGNOSIS — M7711 Lateral epicondylitis, right elbow: Secondary | ICD-10-CM

## 2021-07-08 DIAGNOSIS — G8929 Other chronic pain: Secondary | ICD-10-CM | POA: Diagnosis not present

## 2021-07-08 DIAGNOSIS — M9903 Segmental and somatic dysfunction of lumbar region: Secondary | ICD-10-CM | POA: Diagnosis not present

## 2021-07-08 DIAGNOSIS — M9902 Segmental and somatic dysfunction of thoracic region: Secondary | ICD-10-CM

## 2021-07-08 DIAGNOSIS — M545 Low back pain, unspecified: Secondary | ICD-10-CM

## 2021-07-08 DIAGNOSIS — M9904 Segmental and somatic dysfunction of sacral region: Secondary | ICD-10-CM | POA: Diagnosis not present

## 2021-07-08 NOTE — Assessment & Plan Note (Signed)
Chronic, with mild exacerbation.  Responding well though to osteopathic manipulation.  Has Zanaflex and the meloxicam.  He tries to not use them very regularly though.  Follow-up with me again in 2 months

## 2021-07-08 NOTE — Assessment & Plan Note (Signed)
Improvement noted again.  We will continue to monitor but patient is having very minimal pain.

## 2021-07-20 ENCOUNTER — Other Ambulatory Visit: Payer: Self-pay

## 2021-08-02 ENCOUNTER — Other Ambulatory Visit: Payer: Self-pay

## 2021-08-02 ENCOUNTER — Telehealth: Payer: 59 | Admitting: Emergency Medicine

## 2021-08-02 DIAGNOSIS — B354 Tinea corporis: Secondary | ICD-10-CM | POA: Diagnosis not present

## 2021-08-02 MED ORDER — FLUCONAZOLE 150 MG PO TABS
150.0000 mg | ORAL_TABLET | ORAL | 0 refills | Status: DC
  Filled 2021-08-02: qty 4, 28d supply, fill #0

## 2021-08-02 MED ORDER — MICONAZOLE NITRATE 2 % EX CREA
1.0000 "application " | TOPICAL_CREAM | Freq: Two times a day (BID) | CUTANEOUS | 1 refills | Status: AC
  Filled 2021-08-02: qty 28.35, 14d supply, fill #0

## 2021-08-02 NOTE — Patient Instructions (Signed)
Stacey Parker, thank you for joining Carvel Getting, NP for today's virtual visit.  While this provider is not your primary care provider (PCP), if your PCP is located in our provider database this encounter information will be shared with them immediately following your visit.  Consent: (Patient) Stacey Parker provided verbal consent for this virtual visit at the beginning of the encounter.  Current Medications:  Current Outpatient Medications:    fluconazole (DIFLUCAN) 150 MG tablet, Take 1 tablet (150 mg total) by mouth once a week., Disp: 4 tablet, Rfl: 0   miconazole (MICATIN) 2 % cream, Apply 1 application topically 2 (two) times daily., Disp: 28.35 g, Rfl: 1   COVID-19 At Home Antigen Test (CARESTART COVID-19 HOME TEST) KIT, Use as directed, Disp: 2 kit, Rfl: 0   Diclofenac Sodium (PENNSAID) 2 % SOLN, Place 2 g onto the skin 2 (two) times daily., Disp: 112 g, Rfl: 3   Diclofenac Sodium 2 % SOLN, Place 2 g onto the skin 2 (two) times daily., Disp: 112 g, Rfl: 3   estradiol (VIVELLE-DOT) 0.0375 MG/24HR, Place 1 patch onto the skin and change twice weekly, Disp: 8 patch, Rfl: 5   estradiol (VIVELLE-DOT) 0.0375 MG/24HR, Change patch twice weekly, Disp: 24 patch, Rfl: 1   estradiol (VIVELLE-DOT) 0.05 MG/24HR patch, APPLY 1 PATCH AND CHANGE TWICE WEEKLY, Disp: 8 patch, Rfl: 5   estradiol (VIVELLE-DOT) 0.05 MG/24HR patch, APPLY PATCH TWICE WEEKLY, Disp: 8 patch, Rfl: 3   levothyroxine (SYNTHROID) 50 MCG tablet, TAKE 1 TABLET BY MOUTH ONCE DAILY ON EMPTY STOMACH, Disp: 90 tablet, Rfl: 1   levothyroxine (SYNTHROID) 50 MCG tablet, TAKE 1 TABLET BY MOUTH ONCE DAILY ON AN EMPTY STOMACH, Disp: 90 tablet, Rfl: 1   meloxicam (MOBIC) 7.5 MG tablet, Take 1 tablet (7.5 mg total) by mouth daily., Disp: 30 tablet, Rfl: 0   nitroGLYCERIN (NITRO-DUR) 0.2 mg/hr patch, Apply 1/4 of a patch to skin once daily., Disp: 30 patch, Rfl: 0   oseltamivir (TAMIFLU) 75 MG capsule, Take 1 capsule (75 mg  total) by mouth 2 (two) times daily., Disp: 10 capsule, Rfl: 0   predniSONE (DELTASONE) 20 MG tablet, TAKE 1 TABLET (20 MG TOTAL) BY MOUTH DAILY WITH BREAKFAST., Disp: 5 tablet, Rfl: 0   Progesterone Micronized (PROGESTERONE PO), Take by mouth. Take on days 12-26 through of cycle, Disp: , Rfl:    SYNTHROID 50 MCG tablet, , Disp: , Rfl: 1   tiZANidine (ZANAFLEX) 2 MG tablet, TAKE 1 TABLET (2 MG TOTAL) BY MOUTH AT BEDTIME., Disp: 30 tablet, Rfl: 0   Medications ordered in this encounter:  Meds ordered this encounter  Medications   fluconazole (DIFLUCAN) 150 MG tablet    Sig: Take 1 tablet (150 mg total) by mouth once a week.    Dispense:  4 tablet    Refill:  0   miconazole (MICATIN) 2 % cream    Sig: Apply 1 application topically 2 (two) times daily.    Dispense:  28.35 g    Refill:  1     *If you need refills on other medications prior to your next appointment, please contact your pharmacy*  Follow-Up: Call back or seek an in-person evaluation if the symptoms worsen or if the condition fails to improve as anticipated.  Other Instructions If this treatment plan does not completely resolve your rash, you will need to be seen in person.  You may consider scheduling a dermatology appointment.   If you have been instructed  to have an in-person evaluation today at a local Urgent Care facility, please use the link below. It will take you to a list of all of our available [facility], including address, phone number and hours of operation. Please do not delay care.  [facility]  If you or a family member do not have a primary care provider, use the link below to schedule a visit and establish care. When you choose a [facility] primary care physician or advanced practice provider, you gain a long-term partner in health. Find a Primary Care Provider  Learn more about [facility] in-office and virtual care options: [facility]

## 2021-08-02 NOTE — Progress Notes (Signed)
Virtual Visit Consent   JOYLENE Parker, you are scheduled for a virtual visit with a [facility] provider today.     Just as with appointments in the office, your consent must be obtained to participate.  Your consent will be active for this visit and any virtual visit you may have with one of our providers in the next 365 days.     If you have a MyChart account, a copy of this consent can be sent to you electronically.  All virtual visits are billed to your insurance company just like a traditional visit in the office.    As this is a virtual visit, video technology does not allow for your provider to perform a traditional examination.  This may limit your provider's ability to fully assess your condition.  If your provider identifies any concerns that need to be evaluated in person or the need to arrange testing (such as labs, EKG, etc.), we will make arrangements to do so.     Although advances in technology are sophisticated, we cannot ensure that it will always work on either your end or our end.  If the connection with a video visit is poor, the visit may have to be switched to a telephone visit.  With either a video or telephone visit, we are not always able to ensure that we have a secure connection.     I need to obtain your verbal consent now.   Are you willing to proceed with your visit today?    Stacey Parker has provided verbal consent on 08/02/2021 for a virtual visit (video or telephone).   Stacey Getting, NP   Date: 08/02/2021 3:07 PM   Virtual Visit via Video Note   I, Stacey Parker, connected with  Stacey Parker  ([id number], 1971-02-03) on 08/02/21 at  3:00 PM EST by a video-enabled telemedicine application and verified that I am speaking with the correct person using two identifiers.  Location: Patient: Virtual Visit Location Patient: Home Provider: Virtual Visit Location Provider: Home Office   I discussed the limitations of evaluation and management by  telemedicine and the availability of in person appointments. The patient expressed understanding and agreed to proceed.    History of Present Illness: Stacey Parker is a 51 y.o. who identifies as a female who was assigned female at birth, and is being seen today for rash under both armpits.  Rash has been there for about a month.  The rash is itchy.  She does report that she plays tennis and has been wearing long sleeves and Parker very sweaty when she plays.  If first she did not pay attention to the fact that her armpits were itching and then she looked and discovered a pink rash that is annular with a central white area.  Her mother is a Marine scientist and thinks that it is ringworm.  Based on Internet research, patient thinks it is ringworm.  She has been using over-the-counter Lotrimin cream with some improvement of symptoms but not complete resolution.  HPI: HPI  Problems:  Patient Active Problem List   Diagnosis Date Noted   Cervical pain (neck) 08/05/2020   Low back pain 11/26/2019   Nonallopathic lesion of lumbosacral region 05/16/2019   Nonallopathic lesion of sacral region 05/16/2019   Nonallopathic lesion of thoracic region 05/16/2019   Lateral epicondylitis of right elbow 04/05/2019   Greater trochanteric bursitis of right hip 04/05/2019   Hashimoto's thyroiditis 05/10/2016   PCOS (polycystic  ovarian syndrome) 03/27/2015   Irregular menses 03/27/2015    Allergies:  Allergies  Allergen Reactions   Sulfa Antibiotics Other (See Comments)   Medications:  Current Outpatient Medications:    fluconazole (DIFLUCAN) 150 MG tablet, Take 1 tablet (150 mg total) by mouth once a week., Disp: 4 tablet, Rfl: 0   miconazole (MICATIN) 2 % cream, Apply 1 application topically 2 (two) times daily., Disp: 28.35 g, Rfl: 1   COVID-19 At Home Antigen Test (CARESTART COVID-19 HOME TEST) KIT, Use as directed, Disp: 2 kit, Rfl: 0   Diclofenac Sodium (PENNSAID) 2 % SOLN, Place 2 g onto the skin 2 (two)  times daily., Disp: 112 g, Rfl: 3   Diclofenac Sodium 2 % SOLN, Place 2 g onto the skin 2 (two) times daily., Disp: 112 g, Rfl: 3   estradiol (VIVELLE-DOT) 0.0375 MG/24HR, Place 1 patch onto the skin and change twice weekly, Disp: 8 patch, Rfl: 5   estradiol (VIVELLE-DOT) 0.0375 MG/24HR, Change patch twice weekly, Disp: 24 patch, Rfl: 1   estradiol (VIVELLE-DOT) 0.05 MG/24HR patch, APPLY 1 PATCH AND CHANGE TWICE WEEKLY, Disp: 8 patch, Rfl: 5   estradiol (VIVELLE-DOT) 0.05 MG/24HR patch, APPLY PATCH TWICE WEEKLY, Disp: 8 patch, Rfl: 3   levothyroxine (SYNTHROID) 50 MCG tablet, TAKE 1 TABLET BY MOUTH ONCE DAILY ON EMPTY STOMACH, Disp: 90 tablet, Rfl: 1   levothyroxine (SYNTHROID) 50 MCG tablet, TAKE 1 TABLET BY MOUTH ONCE DAILY ON AN EMPTY STOMACH, Disp: 90 tablet, Rfl: 1   meloxicam (MOBIC) 7.5 MG tablet, Take 1 tablet (7.5 mg total) by mouth daily., Disp: 30 tablet, Rfl: 0   nitroGLYCERIN (NITRO-DUR) 0.2 mg/hr patch, Apply 1/4 of a patch to skin once daily., Disp: 30 patch, Rfl: 0   oseltamivir (TAMIFLU) 75 MG capsule, Take 1 capsule (75 mg total) by mouth 2 (two) times daily., Disp: 10 capsule, Rfl: 0   predniSONE (DELTASONE) 20 MG tablet, TAKE 1 TABLET (20 MG TOTAL) BY MOUTH DAILY WITH BREAKFAST., Disp: 5 tablet, Rfl: 0   Progesterone Micronized (PROGESTERONE PO), Take by mouth. Take on days 12-26 through of cycle, Disp: , Rfl:    SYNTHROID 50 MCG tablet, , Disp: , Rfl: 1   tiZANidine (ZANAFLEX) 2 MG tablet, TAKE 1 TABLET (2 MG TOTAL) BY MOUTH AT BEDTIME., Disp: 30 tablet, Rfl: 0  Observations/Objective: Patient is well-developed, well-nourished in no acute distress.  Resting comfortably  at home.  Head is normocephalic, atraumatic.  No labored breathing.  Speech is clear and coherent with logical content.  Patient is alert and oriented at baseline.  Patient does show me a bilateral axilla by video.  Although its not completely easy to see, it does appear that she has a pink rash around her  axilla with a central white area.  Assessment and Plan: 1. Tinea corporis  Given symptoms and appearance of rash, given that Lotrimin has helped improve symptoms somewhat but not completely, will treat for tinea corporis.  Prescribed Diflucan weekly for 4 weeks and miconazole cream to be used twice daily for 2 weeks.  Discussed that if this treatment plan does not improve and completely resolve symptoms, she will need an in person visit discussed potentially following up with dermatology.  Follow Up Instructions: I discussed the assessment and treatment plan with the patient. The patient was provided an opportunity to ask questions and all were answered. The patient agreed with the plan and demonstrated an understanding of the instructions.  A copy of instructions were sent to  the patient via MyChart unless otherwise noted below.   The patient was advised to call back or seek an in-person evaluation if the symptoms worsen or if the condition fails to improve as anticipated.  Time:  I spent 10 minutes with the patient via telehealth technology discussing the above problems/concerns.    Stacey Getting, NP

## 2021-08-17 ENCOUNTER — Other Ambulatory Visit: Payer: Self-pay

## 2021-09-06 ENCOUNTER — Other Ambulatory Visit: Payer: Self-pay

## 2021-09-09 NOTE — Progress Notes (Signed)
[facility] [place] Phone: [phone] Subjective:   [name], am serving as a scribe for Dr. Hulan Saas.  This visit occurred during the SARS-CoV-2 public health emergency.  Safety protocols were in place, including screening questions prior to the visit, additional usage of staff PPE, and extensive cleaning of exam room while observing appropriate contact time as indicated for disinfecting solutions.   I'm seeing this patient by the request  of:  Patient, No Pcp Per (Inactive)  CC: Neck and back pain follow-up  [id number]  Stacey Parker is a 51 y.o. female coming in with complaint of back and neck pain. OMT on 07/08/2021. Patient states that she had increase in her neck over past couple of days. Patient has been working on posture which has been helpful up until this week.   Patient also notices increase in medial elbow pain over the distal tricep, bilaterally. L>R. Stopped doing band work which has helped to decrease her pain. Tried using DB and this flared up her elbow pain. Using Pennsaid over area.   Medications patient has been prescribed: None  Taking:         Reviewed prior external information including notes and imaging from previsou exam, outside providers and external EMR if available.   As well as notes that were available from care everywhere and other healthcare systems.  Past medical history, social, surgical and family history all reviewed in electronic medical record.  No pertanent information unless stated regarding to the chief complaint.   Past Medical History:  Diagnosis Date   Hashimoto's thyroiditis    Polycystic ovary     Allergies  Allergen Reactions   Sulfa Antibiotics Other (See Comments)     Review of Systems:  No headache, visual changes, nausea, vomiting, diarrhea, constipation, dizziness, abdominal pain, skin rash, fevers, chills, night sweats, weight loss,  swollen lymph nodes, body aches, joint swelling, chest pain, shortness of breath, mood changes. POSITIVE muscle aches  Objective  Blood pressure 100/64, pulse 73, height 5\' 4"  (1.626 m), weight 153 lb (69.4 kg), SpO2 97 %.   General: No apparent distress alert and oriented x3 mood and affect normal, dressed appropriately.  HEENT: Pupils equal, extraocular movements intact  Respiratory: Patient's speak in full sentences and does not appear short of breath  Cardiovascular: No lower extremity edema, non tender, no erythema  Neck exam does have loss of lordosis Low back exam loss of lordosis positive FABER  Increased tenderness over the triceps bilaterally.  No significant swelling though noted.  Seems to be more at the elbow.  Minimal discomfort over the lateral epicondylar region on the right side.  Osteopathic findings  C2 flexed rotated and side bent right C7 flexed rotated and side bent left T3 extended rotated and side bent right inhaled rib T8 extended rotated and side bent left L2 flexed rotated and side bent right Sacrum right on right       Assessment and Plan:  Low back pain Chronic, mild exacerbation.  Still responding extremely well to osteopathic manipulation. Discussed which activities to do and which ones to avoid.  Patient will continue to stay active.  Do feel that working out will be beneficial.  Does have meloxicam for breakthrough pain.  Follow-up again in 6 weeks    Nonallopathic problems  Decision today to treat with OMT was based on Physical Exam  After verbal consent patient was treated with HVLA, ME, FPR techniques in  cervical, rib, thoracic, lumbar, and sacral  areas  Patient tolerated the procedure well with improvement in symptoms  Patient given exercises, stretches and lifestyle modifications  See medications in patient instructions if given  Patient will follow up in 4-8 weeks      The above documentation has been reviewed and is accurate  and complete Lyndal Pulley, DO       Note: This dictation was prepared with Dragon dictation along with smaller phrase technology. Any transcriptional errors that result from this process are unintentional.

## 2021-09-10 ENCOUNTER — Encounter: Payer: Self-pay | Admitting: Family Medicine

## 2021-09-10 ENCOUNTER — Ambulatory Visit: Payer: 59 | Admitting: Family Medicine

## 2021-09-10 ENCOUNTER — Other Ambulatory Visit: Payer: Self-pay

## 2021-09-10 VITALS — BP 100/64 | HR 73 | Ht 64.0 in | Wt 153.0 lb

## 2021-09-10 DIAGNOSIS — M9902 Segmental and somatic dysfunction of thoracic region: Secondary | ICD-10-CM

## 2021-09-10 DIAGNOSIS — M9901 Segmental and somatic dysfunction of cervical region: Secondary | ICD-10-CM

## 2021-09-10 DIAGNOSIS — M9908 Segmental and somatic dysfunction of rib cage: Secondary | ICD-10-CM | POA: Diagnosis not present

## 2021-09-10 DIAGNOSIS — M542 Cervicalgia: Secondary | ICD-10-CM | POA: Diagnosis not present

## 2021-09-10 DIAGNOSIS — M545 Low back pain, unspecified: Secondary | ICD-10-CM | POA: Diagnosis not present

## 2021-09-10 DIAGNOSIS — G8929 Other chronic pain: Secondary | ICD-10-CM

## 2021-09-10 DIAGNOSIS — M9904 Segmental and somatic dysfunction of sacral region: Secondary | ICD-10-CM | POA: Diagnosis not present

## 2021-09-10 DIAGNOSIS — M9903 Segmental and somatic dysfunction of lumbar region: Secondary | ICD-10-CM

## 2021-09-10 NOTE — Assessment & Plan Note (Signed)
Chronic, mild exacerbation.  Still responding extremely well to osteopathic manipulation. Discussed which activities to do and which ones to avoid.  Patient will continue to stay active.  Do feel that working out will be beneficial.  Does have meloxicam for breakthrough pain.  Follow-up again in 6 weeks

## 2021-09-10 NOTE — Assessment & Plan Note (Signed)
Was having more bilateral elbow pain.  Seem to be more tricep.  We discussed different changes in patient's ergonomics that I think will be more beneficial.  Discussed icing regimen and home exercises.  Follow-up again in 4 to 8 weeks

## 2021-09-10 NOTE — Patient Instructions (Signed)
Good to see you Planks on knees are still plank Put band aside Ok to plan tennis See me in 6 weeks

## 2021-09-16 DIAGNOSIS — H524 Presbyopia: Secondary | ICD-10-CM | POA: Diagnosis not present

## 2021-10-11 ENCOUNTER — Other Ambulatory Visit: Payer: Self-pay

## 2021-10-13 NOTE — Progress Notes (Signed)
?Terrilee Files D.O. ?[facility] ?[address] [place] [address] ?Phone: [phone] ?Subjective:   ?I, Wilford Grist, am serving as a scribe for Dr. Antoine Primas. ? ?This visit occurred during the SARS-CoV-2 public health emergency.  Safety protocols were in place, including screening questions prior to the visit, additional usage of staff PPE, and extensive cleaning of exam room while observing appropriate contact time as indicated for disinfecting solutions.  ? ?I'm seeing this patient by the request  of:  Patient, No Pcp Per (Inactive) ? ?CC: Back and neck pain follow-up ? ?[id number]  ?Stacey Parker is a 51 y.o. female coming in with complaint of back and neck pain. OMT 09/10/2021. Patient states that she continued to have L elbow pain over medial aspect. Pain has been intermittent. Patient has stopped using bands and doing planks. Pain more with backhand as patient is R handed. Has been stretching and using theragun. Is in middle of tennis season and will likely not be taking a break soon. Neck is also tight but better. Lumbar spine is tight as well but she mitigates that with stretching regularly.  ? ?Medications patient has been prescribed: None ? ?Taking: ? ? ?  ? ?Reviewed prior external information including notes and imaging from previsou exam, outside providers and external EMR if available.  ? ?As well as notes that were available from care everywhere and other healthcare systems. ? ?Past medical history, social, surgical and family history all reviewed in electronic medical record.  No pertanent information unless stated regarding to the chief complaint.  ? ?Past Medical History:  ?Diagnosis Date  ? Hashimoto's thyroiditis   ? Polycystic ovary   ?  ?Allergies  ?Allergen Reactions  ? Sulfa Antibiotics Other (See Comments)  ? ? ? ?Review of Systems: ? No headache, visual changes, nausea, vomiting, diarrhea, constipation, dizziness, abdominal pain, skin rash, fevers, chills,  night sweats, weight loss, swollen lymph nodes, body aches, joint swelling, chest pain, shortness of breath, mood changes. POSITIVE muscle aches ? ?Objective  ?Blood pressure 108/76, pulse 65, height 5\' 4"  (1.626 m), weight 151 lb (68.5 kg), SpO2 99 %. ?  ?General: No apparent distress alert and oriented x3 mood and affect normal, dressed appropriately.  ?HEENT: Pupils equal, extraocular movements intact  ?Respiratory: Patient's speak in full sentences and does not appear short of breath  ?Cardiovascular: No lower extremity edema, non tender, no erythema  ?Left elbow exam shows the patient does have tenderness both on the medial and lateral epicondylar area.  Worsening pain with resisted pronation and supination.  Patient does have a mildly positive Tinel's. ? ?Limited muscular skeletal ultrasound was performed and interpreted by , M  ?Limited ultrasound shows the patient does have a superficial ulnar nerve at the cubital tunnel area.  Patient does have hypertrophy of the tricep tendon that does come into close proximity to this area. 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 ?Osteopathic findings ? ?C2 flexed rotated and side bent right ?C6 flexed rotated and side bent left ?T3 extended rotated and side bent right inhaled rib ?T9 extended rotated and side bent left ?L2 flexed rotated and side bent right ?Sacrum right on right ? ? ? ? ?  ?Assessment and Plan: ? ?Left elbow pain ?Patient does have left elbow pain that seems to be more secondary to hypertrophy of the tricep and partial ulnar subluxation of the nerves at the cubital tunnel.  Discussed with patient about  icing regimen and home exercises, which activities to do which ones to avoid.  Discussed potential compression, discussed muscle imbalances.  Follow-up again in 6 to 8 weeks. ?  ? ?Nonallopathic problems ? ?Decision today to treat with OMT was based on Physical Exam ? ?After verbal consent  patient was treated with HVLA, ME, FPR techniques in cervical, rib, thoracic, lumbar, and sacral  areas ? ?Patient tolerated the procedure well with improvement in symptoms ? ?Patient given exercises, stretches and lifestyle modifications ? ?See medications in patient instructions if given ? ?Patient will follow up in 4-8 weeks ? ?  ? ? ?The above documentation has been reviewed and is accurate and complete Judi Saa, DO ? ? ? ?  ? ? Note: This dictation was prepared with Dragon dictation along with smaller phrase technology. Any transcriptional errors that result from this process are unintentional.    ?  ?  ? ?

## 2021-10-18 DIAGNOSIS — K589 Irritable bowel syndrome without diarrhea: Secondary | ICD-10-CM | POA: Diagnosis not present

## 2021-10-18 DIAGNOSIS — E282 Polycystic ovarian syndrome: Secondary | ICD-10-CM | POA: Diagnosis not present

## 2021-10-18 DIAGNOSIS — E063 Autoimmune thyroiditis: Secondary | ICD-10-CM | POA: Diagnosis not present

## 2021-10-19 ENCOUNTER — Other Ambulatory Visit: Payer: Self-pay

## 2021-10-19 ENCOUNTER — Ambulatory Visit: Payer: 59 | Admitting: Family Medicine

## 2021-10-19 ENCOUNTER — Encounter: Payer: Self-pay | Admitting: Family Medicine

## 2021-10-19 ENCOUNTER — Ambulatory Visit: Payer: Self-pay

## 2021-10-19 VITALS — BP 108/76 | HR 65 | Ht 64.0 in | Wt 151.0 lb

## 2021-10-19 DIAGNOSIS — M9901 Segmental and somatic dysfunction of cervical region: Secondary | ICD-10-CM

## 2021-10-19 DIAGNOSIS — M9903 Segmental and somatic dysfunction of lumbar region: Secondary | ICD-10-CM | POA: Diagnosis not present

## 2021-10-19 DIAGNOSIS — M9904 Segmental and somatic dysfunction of sacral region: Secondary | ICD-10-CM | POA: Diagnosis not present

## 2021-10-19 DIAGNOSIS — M9908 Segmental and somatic dysfunction of rib cage: Secondary | ICD-10-CM | POA: Diagnosis not present

## 2021-10-19 DIAGNOSIS — M25522 Pain in left elbow: Secondary | ICD-10-CM | POA: Diagnosis not present

## 2021-10-19 DIAGNOSIS — M9902 Segmental and somatic dysfunction of thoracic region: Secondary | ICD-10-CM | POA: Diagnosis not present

## 2021-10-19 MED ORDER — GABAPENTIN 100 MG PO CAPS
100.0000 mg | ORAL_CAPSULE | Freq: Every day | ORAL | 0 refills | Status: DC
  Filled 2021-10-19: qty 90, 90d supply, fill #0

## 2021-10-19 NOTE — Patient Instructions (Addendum)
Compression with playing ?Try to not have full extension in backhand ?Gabapentin 100mg  at night ?See me 5-6 weeks ?

## 2021-10-20 ENCOUNTER — Encounter: Payer: Self-pay | Admitting: Family Medicine

## 2021-10-20 ENCOUNTER — Other Ambulatory Visit: Payer: Self-pay

## 2021-10-20 DIAGNOSIS — M25522 Pain in left elbow: Secondary | ICD-10-CM | POA: Insufficient documentation

## 2021-10-20 MED ORDER — SYNTHROID 50 MCG PO TABS
ORAL_TABLET | ORAL | 1 refills | Status: DC
  Filled 2021-10-20: qty 90, 90d supply, fill #0
  Filled 2022-01-18: qty 90, 90d supply, fill #1

## 2021-10-20 NOTE — Assessment & Plan Note (Signed)
Patient does have left elbow pain that seems to be more secondary to hypertrophy of the tricep and partial ulnar subluxation of the nerves at the cubital tunnel.  Discussed with patient about icing regimen and home exercises, which activities to do which ones to avoid.  Discussed potential compression, discussed muscle imbalances.  Follow-up again in 6 to 8 weeks. ?

## 2021-10-26 ENCOUNTER — Other Ambulatory Visit: Payer: Self-pay

## 2021-10-26 MED ORDER — ESTRADIOL 0.05 MG/24HR TD PTTW
MEDICATED_PATCH | TRANSDERMAL | 3 refills | Status: DC
  Filled 2021-10-26: qty 8, 30d supply, fill #0
  Filled 2021-11-23: qty 8, 30d supply, fill #1
  Filled 2021-12-29: qty 8, 30d supply, fill #2
  Filled 2022-02-03: qty 8, 30d supply, fill #3

## 2021-11-22 NOTE — Progress Notes (Signed)
?Charlann Boxer D.O. ?[facility] ?[place] ?Phone: [phone] ?Subjective:   ?I, Jacqualin Combes, am serving as a scribe for Dr. Hulan Saas. ? ?This visit occurred during the SARS-CoV-2 public health emergency.  Safety protocols were in place, including screening questions prior to the visit, additional usage of staff PPE, and extensive cleaning of exam room while observing appropriate contact time as indicated for disinfecting solutions.  ? ?I'm seeing this patient by the request  of:  Patient, No Pcp Per (Inactive) ? ?CC: neck and back pain  ? ?[id number]  ?KINDELL HISLOP is a 51 y.o. female coming in with complaint of back and neck pain. OMT on 10/19/2021. Also left elbow pain. Patient states that her pain is not improving. Pain worse with resistance bands Pain in R elbow is better. Right side of neck is still bothering her.  ? ?Medications patient has been prescribed: estradiol, synthroid, gabapentin ? ? ? ?  ? ? ? ? ?Reviewed prior external information including notes and imaging from previsou exam, outside providers and external EMR if available.  ? ?As well as notes that were available from care everywhere and other healthcare systems. ? ?Past medical history, social, surgical and family history all reviewed in electronic medical record.  No pertanent information unless stated regarding to the chief complaint.  ? ?Past Medical History:  ?Diagnosis Date  ? Hashimoto's thyroiditis   ? Polycystic ovary   ?  ?Allergies  ?Allergen Reactions  ? Sulfa Antibiotics Other (See Comments)  ? ? ? ?Review of Systems: ? No headache, visual changes, nausea, vomiting, diarrhea, constipation, dizziness, abdominal pain, skin rash, fevers, chills, night sweats, weight loss, swollen lymph nodes, body aches, joint swelling, chest pain, shortness of breath, mood changes. POSITIVE muscle aches ? ?Objective  ?Blood pressure 110/82, pulse (!) 58, height 5\' 4"  (1.626 m), weight 153 lb  (69.4 kg), SpO2 98 %. ?  ?General: No apparent distress alert and oriented x3 mood and affect normal, dressed appropriately.  ?HEENT: Pupils equal, extraocular movements intact  ?Respiratory: Patient's speak in full sentences and does not appear short of breath  ?Cardiovascular: No lower extremity edema, non tender, no erythema  ?Left lumbar ttp on on right more then left  ?Left elbow still TTP over the tricep tendon but improved noted as well  ? ?Osteopathic findings ? ?C2 flexed rotated and side bent right ?C4 flexed rotated and side bent left ?T3 extended rotated and side bent right inhaled rib ?T7 extended rotated and side bent left ?L2 flexed rotated and side bent right ?Sacrum right on right ? ?Limited muscular skeletal ultrasound was performed and interpreted by Hulan Saas, M  ?Limited study shows no effusion, no increase in doppler flow still mild hypertrophy of muscle of tricep with close proximity of the ulnar nerve which is superficial.  ? ? ? ?  ?Assessment and Plan: ? ?Left elbow pain ?Patient continues to have what appears to be more of an ulnar subluxation noted.  Patient would like to continue with conservative therapy.  Worsening pain will consider the possibility of injections.  Discussed which activities to do and which ones to avoid.  Discussed icing regimen and home exercises.  Follow-up again in 6 to 8 weeks otherwise.  ? ?Nonallopathic problems ? ?Decision today to treat with OMT was based on Physical Exam ? ?After verbal consent patient was treated with HVLA, ME, FPR techniques in cervical, rib, thoracic, lumbar, and sacral  areas ? ?Patient tolerated  the procedure well with improvement in symptoms ? ?Patient given exercises, stretches and lifestyle modifications ? ?See medications in patient instructions if given ? ?Patient will follow up in 4-8 weeks ? ?  ? ? ?The above documentation has been reviewed and is accurate and complete Lyndal Pulley, DO ? ? ? ?  ? ? Note: This dictation was  prepared with Dragon dictation along with smaller phrase technology. Any transcriptional errors that result from this process are unintentional.    ?  ?  ? ?

## 2021-11-23 ENCOUNTER — Other Ambulatory Visit: Payer: Self-pay

## 2021-11-23 ENCOUNTER — Ambulatory Visit: Payer: 59 | Admitting: Family Medicine

## 2021-11-23 ENCOUNTER — Ambulatory Visit: Payer: Self-pay

## 2021-11-23 VITALS — BP 110/82 | HR 58 | Ht 64.0 in | Wt 153.0 lb

## 2021-11-23 DIAGNOSIS — M9901 Segmental and somatic dysfunction of cervical region: Secondary | ICD-10-CM

## 2021-11-23 DIAGNOSIS — G8929 Other chronic pain: Secondary | ICD-10-CM | POA: Diagnosis not present

## 2021-11-23 DIAGNOSIS — M9903 Segmental and somatic dysfunction of lumbar region: Secondary | ICD-10-CM

## 2021-11-23 DIAGNOSIS — M25522 Pain in left elbow: Secondary | ICD-10-CM | POA: Diagnosis not present

## 2021-11-23 DIAGNOSIS — M545 Low back pain, unspecified: Secondary | ICD-10-CM | POA: Diagnosis not present

## 2021-11-23 DIAGNOSIS — M9908 Segmental and somatic dysfunction of rib cage: Secondary | ICD-10-CM | POA: Diagnosis not present

## 2021-11-23 DIAGNOSIS — M9904 Segmental and somatic dysfunction of sacral region: Secondary | ICD-10-CM

## 2021-11-23 DIAGNOSIS — M9902 Segmental and somatic dysfunction of thoracic region: Secondary | ICD-10-CM

## 2021-11-23 MED ORDER — GABAPENTIN 100 MG PO CAPS
200.0000 mg | ORAL_CAPSULE | Freq: Every day | ORAL | 0 refills | Status: DC
  Filled 2021-11-23: qty 180, 90d supply, fill #0

## 2021-11-23 NOTE — Assessment & Plan Note (Signed)
Chronic, patient is making some progress.  We discussed with patient about icing regimen and home exercises and continuing to work on the core strengthening.  Patient is staying very active and doing other things that I think is helpful as well.  All the other injuries seem to be giving more discomfort as well. ?

## 2021-11-23 NOTE — Assessment & Plan Note (Signed)
Patient continues to have what appears to be more of an ulnar subluxation noted.  Patient would like to continue with conservative therapy.  Worsening pain will consider the possibility of injections.  Discussed which activities to do and which ones to avoid.  Discussed icing regimen and home exercises.  Follow-up again in 6 to 8 weeks otherwise. ?

## 2021-11-23 NOTE — Patient Instructions (Addendum)
Limit ROM of elbow with lifting ?Compression band ?Gabapentin 200mg  at night ?See me in 7-8 weeks ?

## 2021-11-25 ENCOUNTER — Other Ambulatory Visit: Payer: Self-pay

## 2021-11-30 ENCOUNTER — Other Ambulatory Visit: Payer: Self-pay

## 2021-11-30 DIAGNOSIS — Z7689 Persons encountering health services in other specified circumstances: Secondary | ICD-10-CM | POA: Diagnosis not present

## 2021-11-30 DIAGNOSIS — Z79890 Hormone replacement therapy (postmenopausal): Secondary | ICD-10-CM | POA: Diagnosis not present

## 2021-11-30 DIAGNOSIS — Z0000 Encounter for general adult medical examination without abnormal findings: Secondary | ICD-10-CM | POA: Diagnosis not present

## 2021-11-30 DIAGNOSIS — R5383 Other fatigue: Secondary | ICD-10-CM | POA: Diagnosis not present

## 2021-11-30 DIAGNOSIS — Z01419 Encounter for gynecological examination (general) (routine) without abnormal findings: Secondary | ICD-10-CM | POA: Diagnosis not present

## 2021-11-30 DIAGNOSIS — Z1231 Encounter for screening mammogram for malignant neoplasm of breast: Secondary | ICD-10-CM | POA: Diagnosis not present

## 2021-11-30 MED ORDER — PROGESTERONE MICRONIZED 100 MG PO CAPS
100.0000 mg | ORAL_CAPSULE | Freq: Every day | ORAL | 3 refills | Status: AC
  Filled 2021-11-30: qty 90, 90d supply, fill #0

## 2021-12-01 ENCOUNTER — Other Ambulatory Visit: Payer: Self-pay

## 2021-12-22 ENCOUNTER — Other Ambulatory Visit: Payer: Self-pay

## 2021-12-29 ENCOUNTER — Other Ambulatory Visit: Payer: Self-pay

## 2022-01-06 NOTE — Progress Notes (Unsigned)
[name] [address] [place] [address] Phone: [phone] Subjective:   Stacey Parker, am serving as a scribe for Dr. Antoine Primas.   I'm seeing this patient by the request  of:  Patient, No Pcp Per (Inactive)  CC: back and neck pain and elbow pain   [id number]  Stacey Parker is a 51 y.o. female coming in with complaint of back and neck pain. OMT on 11/23/2021. Also seen for left elbow pain. Patient states that gabapentin has been working. No longer having sharp pain in elbow. Pain is still there though. Neck pain has slowly been increasing since last visit but nothing out of the norm.   Medications patient has been prescribed: None  Taking:      Reviewed prior external information including notes and imaging from previsou exam, outside providers and external EMR if available.   As well as notes that were available from care everywhere and other healthcare systems.  Past medical history, social, surgical and family history all reviewed in electronic medical record.  No pertanent information unless stated regarding to the chief complaint.   Past Medical History:  Diagnosis Date   Hashimoto's thyroiditis    Polycystic ovary     Allergies  Allergen Reactions   Sulfa Antibiotics Other (See Comments)     Review of Systems:  No headache, visual changes, nausea, vomiting, diarrhea, constipation, dizziness, abdominal pain, skin rash, fevers, chills, night sweats, weight loss, swollen lymph nodes, body aches, joint swelling, chest pain, shortness of breath, mood changes. POSITIVE muscle aches  Objective  Blood pressure 108/74, pulse (!) 57, height 5\' 4"  (1.626 m), weight 153 lb (69.4 kg), SpO2 97 %.   General: No apparent distress alert and oriented x3 mood and affect normal, dressed appropriately.  HEENT: Pupils equal, extraocular movements intact  Respiratory: Patient's speak in full sentences and does not appear short of  breath  Cardiovascular: No lower extremity edema, non tender, no erythema  Neck exam has some mild loss of lordosis.  Patient does have some tightness in the parascapular region.  Low back also has more tightness than usual.  Seems to be more in the thoracolumbar juncture than usual.  Osteopathic findings  C2 flexed rotated and side bent right C5 flexed rotated and side bent left T7 extended rotated and side bent right inhaled rib L5 flexed rotated and side bent left  Sacrum right on right       Assessment and Plan:  Left elbow pain Significant improvement already at this time with the gabapentin.  No significant changes otherwise.  Follow-up with me again in 6 to 8 weeks  Low back pain Chronic problem with mild tightness.  Patient has been very active though.  Patient is doing currently doing more core strengthening as well as still stretching at the end of activities.  Follow-up with me again 6 to 8 weeks    Nonallopathic problems  Decision today to treat with OMT was based on Physical Exam  After verbal consent patient was treated with HVLA, ME, FPR techniques in cervical, rib, thoracic, lumbar, and sacral  areas  Patient tolerated the procedure well with improvement in symptoms  Patient given exercises, stretches and lifestyle modifications  See medications in patient instructions if given  Patient will follow up in 4-8 weeks      The above documentation has been reviewed and is accurate and complete , DO       Note: This  dictation was prepared with Dragon dictation along with smaller phrase technology. Any transcriptional errors that result from this process are unintentional.

## 2022-01-11 ENCOUNTER — Other Ambulatory Visit: Payer: Self-pay

## 2022-01-11 ENCOUNTER — Ambulatory Visit: Payer: Self-pay

## 2022-01-11 ENCOUNTER — Other Ambulatory Visit: Payer: Self-pay | Admitting: Family Medicine

## 2022-01-11 ENCOUNTER — Ambulatory Visit: Payer: 59 | Admitting: Family Medicine

## 2022-01-11 VITALS — BP 108/74 | HR 57 | Ht 64.0 in | Wt 153.0 lb

## 2022-01-11 DIAGNOSIS — M25522 Pain in left elbow: Secondary | ICD-10-CM

## 2022-01-11 DIAGNOSIS — M9903 Segmental and somatic dysfunction of lumbar region: Secondary | ICD-10-CM | POA: Diagnosis not present

## 2022-01-11 DIAGNOSIS — M9902 Segmental and somatic dysfunction of thoracic region: Secondary | ICD-10-CM

## 2022-01-11 DIAGNOSIS — M9904 Segmental and somatic dysfunction of sacral region: Secondary | ICD-10-CM

## 2022-01-11 DIAGNOSIS — M9901 Segmental and somatic dysfunction of cervical region: Secondary | ICD-10-CM | POA: Diagnosis not present

## 2022-01-11 DIAGNOSIS — M545 Low back pain, unspecified: Secondary | ICD-10-CM | POA: Diagnosis not present

## 2022-01-11 DIAGNOSIS — M9908 Segmental and somatic dysfunction of rib cage: Secondary | ICD-10-CM | POA: Diagnosis not present

## 2022-01-11 DIAGNOSIS — G8929 Other chronic pain: Secondary | ICD-10-CM | POA: Diagnosis not present

## 2022-01-11 MED ORDER — GABAPENTIN 100 MG PO CAPS
200.0000 mg | ORAL_CAPSULE | Freq: Every day | ORAL | 0 refills | Status: DC
  Filled 2022-01-11: qty 180, 90d supply, fill #0

## 2022-01-11 NOTE — Assessment & Plan Note (Signed)
Chronic problem with mild tightness.  Patient has been very active though.  Patient is doing currently doing more core strengthening as well as still stretching at the end of activities.  Follow-up with me again 6 to 8 weeks

## 2022-01-11 NOTE — Assessment & Plan Note (Signed)
Significant improvement already at this time with the gabapentin.  No significant changes otherwise.  Follow-up with me again in 6 to 8 weeks

## 2022-01-11 NOTE — Patient Instructions (Signed)
Good to see you See if you can make drive under 43 minutes See me in 7-8 weeks

## 2022-01-18 ENCOUNTER — Other Ambulatory Visit: Payer: Self-pay

## 2022-01-28 ENCOUNTER — Other Ambulatory Visit: Payer: Self-pay

## 2022-01-28 ENCOUNTER — Telehealth: Payer: 59 | Admitting: Physician Assistant

## 2022-01-28 DIAGNOSIS — J45990 Exercise induced bronchospasm: Secondary | ICD-10-CM

## 2022-01-28 MED ORDER — BUDESONIDE-FORMOTEROL FUMARATE 80-4.5 MCG/ACT IN AERO
2.0000 | INHALATION_SPRAY | Freq: Two times a day (BID) | RESPIRATORY_TRACT | 0 refills | Status: AC
  Filled 2022-01-28: qty 10.2, 30d supply, fill #0

## 2022-01-28 MED ORDER — FLUTICASONE FUROATE-VILANTEROL 100-25 MCG/ACT IN AEPB
1.0000 | INHALATION_SPRAY | Freq: Every day | RESPIRATORY_TRACT | 0 refills | Status: DC
  Filled 2022-01-28: qty 60, 60d supply, fill #0

## 2022-01-28 MED ORDER — ALBUTEROL SULFATE HFA 108 (90 BASE) MCG/ACT IN AERS
1.0000 | INHALATION_SPRAY | Freq: Four times a day (QID) | RESPIRATORY_TRACT | 0 refills | Status: DC | PRN
  Filled 2022-01-28: qty 8.5, 25d supply, fill #0

## 2022-01-28 NOTE — Progress Notes (Signed)
Visit for Asthma  Based on what you have shared with me, it looks like you may have a flare up of your asthma.  Asthma is a chronic (ongoing) lung disease which results in airway obstruction, inflammation and hyper-responsiveness.   Asthma symptoms vary from person to person, with common symptoms including nighttime awakening and decreased ability to participate in normal activities as a result of shortness of breath. It is often triggered by changes in weather, changes in the season, changes in air temperature, or inside (home, school, daycare or work) allergens such as animal dander, mold, mildew, woodstoves or cockroaches.   It can also be triggered by hormonal changes, extreme emotion, physical exertion or an upper respiratory tract illness.     It is important to identify the trigger, and then eliminate or avoid the trigger if possible.   If you have been prescribed medications to be taken on a regular basis, it is important to follow the asthma action plan and to follow guidelines to adjust medication in response to increasing symptoms of decreased peak expiratory flow rate  Treatment: I have prescribed: Albuterol (Proventil HFA; Ventolin HFA) 108 (90 Base) MCG/ACT Inhaler 1-2 puffs into the lungs every six hours as needed for wheezing or shortness of breath AND to use before exercise   Your symptoms do seem to be more exercise induced asthma. It is possible since you are outdoors, the air quality and humidity may be me triggering at this time and making this worse  HOME CARE Only take medications as instructed by your medical team. Consider wearing a mask or scarf to improve breathing air temperature have been shown to decrease irritation and decrease exacerbations Get rest. Taking a steamy shower or using a humidifier may help nasal congestion sand ease sore throat pain. You can  place a towel over your head and breathe in the steam from hot water coming from a faucet. Using a saline nasal spray works much the same way.  Cough drops, hare candies and sore throat lozenges may ease your cough.  Avoid close contacts especially the very you and the elderly Cover your mouth if you cough or sneeze Always remember to wash your hands.    GET HELP RIGHT AWAY IF: You develop worsening symptoms; breathlessness at rest, drowsy, confused or agitated, unable to speak in full sentences You have coughing fits You develop a severe headache or visual changes You develop shortness of breath, difficulty breathing or start having chest pain Your symptoms persist after you have completed your treatment plan If your symptoms do not improve within 10 days  MAKE SURE YOU Understand these instructions. Will watch your condition. Will get help right away if you are not doing well or get worse.   Your e-visit answers were reviewed by a board certified advanced clinical practitioner to complete your personal care plan, Depending upon the condition, your plan could have included both over the counter or prescription medications.   Please review your pharmacy choice. Your safety is important to [place]. If you have drug allergies check your prescription carefully.  You can use MyChart to ask questions about today's visit, request a non-urgent  call back, or ask for a work or school excuse for 24 hours related to this e-Visit. If it has been greater than 24 hours you will need to follow up with your provider, or enter a new e-Visit to address those concerns.   You will get an e-mail in the next two days asking about your  experience. I hope that your e-visit has been valuable and will speed your recovery. Thank you for using e-visits.  I provided 5 minutes of non face-to-face time during this encounter for chart review and documentation.

## 2022-01-28 NOTE — Addendum Note (Signed)
Addended by: Margaretann Loveless on: 01/28/2022 07:01 PM   Modules accepted: Orders

## 2022-01-28 NOTE — Addendum Note (Signed)
Addended by: Margaretann Loveless on: 01/28/2022 06:23 PM   Modules accepted: Orders

## 2022-01-31 ENCOUNTER — Other Ambulatory Visit: Payer: Self-pay

## 2022-02-03 ENCOUNTER — Other Ambulatory Visit: Payer: Self-pay

## 2022-02-08 ENCOUNTER — Other Ambulatory Visit: Payer: Self-pay | Admitting: Obstetrics and Gynecology

## 2022-02-08 DIAGNOSIS — Z1231 Encounter for screening mammogram for malignant neoplasm of breast: Secondary | ICD-10-CM

## 2022-02-18 DIAGNOSIS — E063 Autoimmune thyroiditis: Secondary | ICD-10-CM | POA: Diagnosis not present

## 2022-02-18 DIAGNOSIS — E282 Polycystic ovarian syndrome: Secondary | ICD-10-CM | POA: Diagnosis not present

## 2022-02-18 DIAGNOSIS — Z0000 Encounter for general adult medical examination without abnormal findings: Secondary | ICD-10-CM | POA: Diagnosis not present

## 2022-02-18 DIAGNOSIS — R5383 Other fatigue: Secondary | ICD-10-CM | POA: Diagnosis not present

## 2022-02-18 DIAGNOSIS — E559 Vitamin D deficiency, unspecified: Secondary | ICD-10-CM | POA: Diagnosis not present

## 2022-02-28 ENCOUNTER — Other Ambulatory Visit: Payer: Self-pay

## 2022-02-28 MED ORDER — ESTRADIOL 0.05 MG/24HR TD PTTW
MEDICATED_PATCH | TRANSDERMAL | 1 refills | Status: AC
  Filled 2022-02-28: qty 24, 84d supply, fill #0
  Filled 2022-05-09: qty 24, 84d supply, fill #1

## 2022-02-28 NOTE — Progress Notes (Unsigned)
[name] [address] [place] [address] Phone: [phone] Subjective:   Stacey Parker, am serving as a scribe for Dr. Antoine Primas.   I'm seeing this patient by the request  of:  Patient, No Pcp Per  CC: neck and back pain   WCB:[id number]  [name] is a 51 y.o. female coming in with complaint of back and neck pain. OMT on 01/11/2022. Patient states that she has tightness in hip and neck. Gabapentin has helped her arm pain. Able to do planks and some light strength training. Needs refill of gabapentin if she is to keep taking it.   Medications patient has been prescribed: Gabapentin  Taking:         Reviewed prior external information including notes and imaging from previsou exam, outside providers and external EMR if available.   As well as notes that were available from care everywhere and other healthcare systems.  Past medical history, social, surgical and family history all reviewed in electronic medical record.  No pertanent information unless stated regarding to the chief complaint.   Past Medical History:  Diagnosis Date   Hashimoto's thyroiditis    Polycystic ovary     Allergies  Allergen Reactions   Sulfa Antibiotics Other (See Comments)     Review of Systems:  No headache, visual changes, nausea, vomiting, diarrhea, constipation, dizziness, abdominal pain, skin rash, fevers, chills, night sweats, weight loss, swollen lymph nodes, body aches, joint swelling, chest pain, shortness of breath, mood changes. POSITIVE muscle aches  Objective  Blood pressure 106/66, pulse (!) 44, height 5\' 4"  (1.626 m), weight 150 lb (68 kg), SpO2 98 %.   General: No apparent distress alert and oriented x3 mood and affect normal, dressed appropriately.  HEENT: Pupils equal, extraocular movements intact  Respiratory: Patient's speak in full sentences and does not appear short of breath  Cardiovascular: No lower extremity  edema, non tender, no erythema  Gait normal normal  MSK:  Back does have some loss of lordosis.  Some tenderness to palpation in the paraspinal musculature.  Patient does have tightness with FABER test bilaterally.  Negative straight leg test. Neck exam still has some mild loss of lordosis as well.  Osteopathic findings  C2 flexed rotated and side bent right C6 flexed rotated and side bent left T3 extended rotated and side bent right inhaled rib T9 extended rotated and side bent left L2 flexed rotated and side bent right Sacrum right on right     Assessment and Plan:  Cervical pain (neck) Chronic.  Still having some radicular symptoms intermittently and that seems to be more on the left arm.  Negative Spurling's though noted today.  Discussed posture and ergonomics.  Discussed which activities to do and which ones to avoid.  Increase activity slowly otherwise.  Follow-up again in 6 to 8 weeks    Nonallopathic problems  Decision today to treat with OMT was based on Physical Exam  After verbal consent patient was treated with HVLA, ME, FPR techniques in cervical, rib, thoracic, lumbar, and sacral  areas  Patient tolerated the procedure well with improvement in symptoms  Patient given exercises, stretches and lifestyle modifications  See medications in patient instructions if given  Patient will follow up in 4-8 weeks     The above documentation has been reviewed and is accurate and complete , DO         Note: This dictation was prepared with Dragon dictation  along with smaller phrase technology. Any transcriptional errors that result from this process are unintentional.

## 2022-03-01 ENCOUNTER — Encounter: Payer: Self-pay | Admitting: Family Medicine

## 2022-03-01 ENCOUNTER — Ambulatory Visit: Payer: 59 | Admitting: Family Medicine

## 2022-03-01 ENCOUNTER — Ambulatory Visit
Admission: RE | Admit: 2022-03-01 | Discharge: 2022-03-01 | Disposition: A | Discharge status: Home / Self Care | Payer: 59 | Source: Ambulatory Visit | Attending: Obstetrics and Gynecology | Admitting: Obstetrics and Gynecology

## 2022-03-01 ENCOUNTER — Other Ambulatory Visit: Payer: Self-pay

## 2022-03-01 VITALS — BP 106/66 | HR 44 | Ht 64.0 in | Wt 150.0 lb

## 2022-03-01 DIAGNOSIS — M9903 Segmental and somatic dysfunction of lumbar region: Secondary | ICD-10-CM | POA: Diagnosis not present

## 2022-03-01 DIAGNOSIS — M542 Cervicalgia: Secondary | ICD-10-CM | POA: Diagnosis not present

## 2022-03-01 DIAGNOSIS — Z1231 Encounter for screening mammogram for malignant neoplasm of breast: Secondary | ICD-10-CM | POA: Diagnosis not present

## 2022-03-01 DIAGNOSIS — M9904 Segmental and somatic dysfunction of sacral region: Secondary | ICD-10-CM | POA: Diagnosis not present

## 2022-03-01 DIAGNOSIS — M9902 Segmental and somatic dysfunction of thoracic region: Secondary | ICD-10-CM | POA: Diagnosis not present

## 2022-03-01 DIAGNOSIS — M9908 Segmental and somatic dysfunction of rib cage: Secondary | ICD-10-CM | POA: Diagnosis not present

## 2022-03-01 DIAGNOSIS — M9901 Segmental and somatic dysfunction of cervical region: Secondary | ICD-10-CM | POA: Diagnosis not present

## 2022-03-01 MED ORDER — GABAPENTIN 100 MG PO CAPS
200.0000 mg | ORAL_CAPSULE | Freq: Every day | ORAL | 0 refills | Status: DC
  Filled 2022-03-01: qty 180, 90d supply, fill #0

## 2022-03-01 NOTE — Patient Instructions (Addendum)
Continue gabapentin See me in 2 months for MSK

## 2022-03-01 NOTE — Assessment & Plan Note (Signed)
Chronic.  Still having some radicular symptoms intermittently and that seems to be more on the left arm.  Negative Spurling's though noted today.  Discussed posture and ergonomics.  Discussed which activities to do and which ones to avoid.  Increase activity slowly otherwise.  Follow-up again in 6 to 8 weeks

## 2022-04-11 ENCOUNTER — Other Ambulatory Visit: Payer: Self-pay

## 2022-04-11 ENCOUNTER — Telehealth: Payer: 59 | Admitting: Physician Assistant

## 2022-04-11 DIAGNOSIS — U071 COVID-19: Secondary | ICD-10-CM | POA: Diagnosis not present

## 2022-04-11 MED ORDER — NIRMATRELVIR/RITONAVIR (PAXLOVID)TABLET
3.0000 | ORAL_TABLET | Freq: Two times a day (BID) | ORAL | 0 refills | Status: AC
  Filled 2022-04-11: qty 30, 5d supply, fill #0

## 2022-04-11 NOTE — Patient Instructions (Signed)
Stacey Parker, thank you for joining Mar Daring, PA-C for today's virtual visit.  While this provider is not your primary care provider (PCP), if your PCP is located in our provider database this encounter information will be shared with them immediately following your visit.  Consent: (Patient) Stacey Parker provided verbal consent for this virtual visit at the beginning of the encounter.  Current Medications:  Current Outpatient Medications:    nirmatrelvir/ritonavir EUA (PAXLOVID) 20 x 150 MG & 10 x $Re'100MG'Qnd$  TABS, Take 3 tablets by mouth 2 (two) times daily for 5 days. (Take nirmatrelvir 150 mg two tablets twice daily for 5 days and ritonavir 100 mg one tablet twice daily for 5 days) Patient GFR is 67, Disp: 30 tablet, Rfl: 0   albuterol (VENTOLIN HFA) 108 (90 Base) MCG/ACT inhaler, Inhale 1-2 puffs into the lungs every 6 (six) hours as needed for wheezing or shortness of breath. And before physical activity, Disp: 8.5 g, Rfl: 0   budesonide-formoterol (SYMBICORT) 80-4.5 MCG/ACT inhaler, Inhale 2 puffs into the lungs 2 (two) times daily., Disp: 10.2 g, Rfl: 0   COVID-19 At Home Antigen Test (CARESTART COVID-19 HOME TEST) KIT, Use as directed, Disp: 2 kit, Rfl: 0   Diclofenac Sodium (PENNSAID) 2 % SOLN, Place 2 g onto the skin 2 (two) times daily., Disp: 112 g, Rfl: 3   Diclofenac Sodium 2 % SOLN, Place 2 g onto the skin 2 (two) times daily., Disp: 112 g, Rfl: 3   estradiol (VIVELLE-DOT) 0.0375 MG/24HR, Place 1 patch onto the skin and change twice weekly, Disp: 8 patch, Rfl: 5   estradiol (VIVELLE-DOT) 0.0375 MG/24HR, Change patch twice weekly, Disp: 24 patch, Rfl: 1   estradiol (VIVELLE-DOT) 0.05 MG/24HR patch, APPLY 1 PATCH AND CHANGE TWICE WEEKLY, Disp: 8 patch, Rfl: 5   estradiol (VIVELLE-DOT) 0.05 MG/24HR patch, APPLY PATCH TWICE WEEKLY, Disp: 8 patch, Rfl: 3   estradiol (VIVELLE-DOT) 0.05 MG/24HR patch, Change patch twice weekly, Disp: 24 patch, Rfl: 1   fluconazole  (DIFLUCAN) 150 MG tablet, Take 1 tablet (150 mg total) by mouth once a week., Disp: 4 tablet, Rfl: 0   gabapentin (NEURONTIN) 100 MG capsule, Take 2 capsules (200 mg total) by mouth at bedtime., Disp: 180 capsule, Rfl: 0   levothyroxine (SYNTHROID) 50 MCG tablet, TAKE 1 TABLET BY MOUTH ONCE DAILY ON EMPTY STOMACH, Disp: 90 tablet, Rfl: 1   meloxicam (MOBIC) 7.5 MG tablet, Take 1 tablet (7.5 mg total) by mouth daily., Disp: 30 tablet, Rfl: 0   miconazole (MICATIN) 2 % cream, Apply 1 application topically 2 (two) times daily., Disp: 28.35 g, Rfl: 1   nitroGLYCERIN (NITRO-DUR) 0.2 mg/hr patch, Apply 1/4 of a patch to skin once daily., Disp: 30 patch, Rfl: 0   oseltamivir (TAMIFLU) 75 MG capsule, Take 1 capsule (75 mg total) by mouth 2 (two) times daily., Disp: 10 capsule, Rfl: 0   progesterone (PROMETRIUM) 100 MG capsule, Take 1 capsule (100 mg total) by mouth at bedtime, Disp: 90 capsule, Rfl: 3   Progesterone Micronized (PROGESTERONE PO), Take by mouth. Take on days 12-26 through of cycle, Disp: , Rfl:    SYNTHROID 50 MCG tablet, , Disp: , Rfl: 1   SYNTHROID 50 MCG tablet, TAKE 1 TABLET BY MOUTH ONCE DAILY ON AN EMPTY STOMACH, Disp: 90 tablet, Rfl: 1   Medications ordered in this encounter:  Meds ordered this encounter  Medications   nirmatrelvir/ritonavir EUA (PAXLOVID) 20 x 150 MG & 10 x $Re'100MG'wJx$  TABS  Sig: Take 3 tablets by mouth 2 (two) times daily for 5 days. (Take nirmatrelvir 150 mg two tablets twice daily for 5 days and ritonavir 100 mg one tablet twice daily for 5 days) Patient GFR is 67    Dispense:  30 tablet    Refill:  0    Order Specific Question:   Supervising Provider    Answer:   Chase Picket [id number]     *If you need refills on other medications prior to your next appointment, please contact your pharmacy*  Follow-Up: Call back or seek an in-person evaluation if the symptoms worsen or if the condition fails to improve as anticipated.  Other Instructions  Quarantine  and Isolation Quarantine and isolation refer to local and travel restrictions to protect the public and travelers from contagious diseases that constitute a public health threat. Contagious diseases are diseases that can spread from one person to another. Quarantine and isolation help to protect the public by preventing exposure to people who have or may have a contagious disease. Isolation separates people who are sick with a contagious disease from people who are not sick. Quarantine separates and restricts the movement of people who were exposed to a contagious disease to see if they become sick. You may be put in quarantine or isolation if you have been exposed to or diagnosed with any of the following diseases: Severe acute respiratory syndromes, such as COVID-19. Cholera. Diphtheria. Tuberculosis. Plague. Smallpox. Yellow fever. Viral hemorrhagic fevers, such as [place], Ebola, and [place]. When to quarantine or isolate Follow these rules, whether you have been vaccinated or not: Stay home and isolate from others when you are sick with a contagious disease. Isolate when you test positive for a contagious disease, even if you do not have symptoms. Isolate if you are sick and suspect that you may have a contagious disease. If you suspect that you have a contagious disease, get tested. If your test results are negative, you can end your isolation. If your test results are positive, follow the full isolation recommendations as told by your health care provider or local health authorities. Quarantine and stay away from others when you have been in close contact with someone who has tested positive for a contagious disease. Close contact is defined as being less than 6 ft (1.8 m) away from an infected person for a total of 15 minutes or more over a 24-hour period. Do not go to places where you are unable to wear a mask, such as restaurants and some gyms. Stay home and separate from  others as much as possible. Avoid being around people who may get very sick from the contagious disease that you have. Use a separate bathroom, if possible. Do not travel. For travel guidance, visit the CDC's travel webpage at SwankBlog.no Follow these instructions at home: Medicines  Take over-the-counter and prescription medicines as told by your health care provider. Finish all antibiotic medicine even when you start to feel better. Stay up to date with all your vaccines. Get scheduled vaccines and boosters as recommended by your health care provider. Lifestyle Wear a high-quality mask if you must be around others at home and in public, if recommended. Improve air flow (ventilation) at home to help prevent the disease from spreading to other people, if possible. Do not share personal household items, like cups, towels, and utensils. Practice everyday hygiene and cleaning. General instructions Talk to your health care provider if you have a weakened body defense system (immune  system). People with a weakened immune system may have a reduced immune response to vaccines. You may need to follow current prevention measures, including wearing a well-fitting mask, avoiding crowds, and avoiding poorly ventilated indoor places. Monitor symptoms and follow health care provider instructions, which may include resting, drinking fluids, and taking medicines. Follow specific isolation and quarantine recommendations if you are in places that can lead to disease outbreaks, such as correctional and detention facilities, homeless shelters, and cruise ships. Return to your normal activities as told by your health care provider. Ask your health care provider what activities are safe for you. Keep all follow-up visits. This is important. Where to find more information CDC: SodaWaters.hu Contact a health care provider if: You have a fever. You have signs and symptoms that return or  get worse after isolation. Get help right away if: You have difficulty breathing. You have chest pain. These symptoms may be an emergency. Get help right away. Call 911. Do not wait to see if the symptoms will go away. Do not drive yourself to the hospital. Summary Isolation and quarantine help protect the public by preventing exposure to people who have or may have a contagious disease. Isolate when you are sick or when you test positive, even if you do not have symptoms. Quarantine and stay away from others when you have been in close contact with someone who has tested positive for a contagious disease. This information is not intended to replace advice given to you by your health care provider. Make sure you discuss any questions you have with your health care provider. Document Revised: 07/29/2021 Document Reviewed: 07/08/2021 Elsevier Patient Education  [employer].    If you have been instructed to have an in-person evaluation today at a local Urgent Care facility, please use the link below. It will take you to a list of all of our available [facility], including address, phone number and hours of operation. Please do not delay care.  [facility]  If you or a family member do not have a primary care provider, use the link below to schedule a visit and establish care. When you choose a [facility] primary care physician or advanced practice provider, you gain a long-term partner in health. Find a Primary Care Provider  Learn more about 's in-office and virtual care options: [facility]

## 2022-04-11 NOTE — Progress Notes (Signed)
Virtual Visit Consent   Stacey Parker, you are scheduled for a virtual visit with a [facility] provider today. Just as with appointments in the office, your consent must be obtained to participate. Your consent will be active for this visit and any virtual visit you may have with one of our providers in the next 365 days. If you have a MyChart account, a copy of this consent can be sent to you electronically.  As this is a virtual visit, video technology does not allow for your provider to perform a traditional examination. This may limit your provider's ability to fully assess your condition. If your provider identifies any concerns that need to be evaluated in person or the need to arrange testing (such as labs, EKG, etc.), we will make arrangements to do so. Although advances in technology are sophisticated, we cannot ensure that it will always work on either your end or our end. If the connection with a video visit is poor, the visit may have to be switched to a telephone visit. With either a video or telephone visit, we are not always able to ensure that we have a secure connection.  By engaging in this virtual visit, you consent to the provision of healthcare and authorize for your insurance to be billed (if applicable) for the services provided during this visit. Depending on your insurance coverage, you may receive a charge related to this service.  I need to obtain your verbal consent now. Are you willing to proceed with your visit today? Stacey Parker has provided verbal consent on 04/11/2022 for a virtual visit (video or telephone). Mar Daring, PA-C  Date: 04/11/2022 4:15 PM  Virtual Visit via Video Note   I, Mar Daring, connected with  Stacey Parker  ([id number], 1971/03/31) on 04/11/22 at  4:00 PM EDT by a video-enabled telemedicine application and verified that I am speaking with the correct person using two identifiers.  Location: Patient: Virtual  Visit Location Patient: Home Provider: Virtual Visit Location Provider: Home Office   I discussed the limitations of evaluation and management by telemedicine and the availability of in person appointments. The patient expressed understanding and agreed to proceed.    History of Present Illness: Stacey Parker is a 51 y.o. who identifies as a female who was assigned female at birth, and is being seen today for Covid 78.  HPI: URI  This is a new problem. Episode onset: Symptoms started Saturday, tested positive on at home test on Sunday. The problem has been gradually worsening. Maximum temperature: low grade fever, 100-101. Associated symptoms include chest pain (heaviness), congestion, coughing, headaches, rhinorrhea, a sore throat and swollen glands. Pertinent negatives include no neck pain, sinus pain or sneezing. Associated symptoms comments: Fatigue. She has tried acetaminophen (vit c) for the symptoms. The treatment provided no relief.     Problems:  Patient Active Problem List   Diagnosis Date Noted   Left elbow pain 10/20/2021   Cervical pain (neck) 08/05/2020   Low back pain 11/26/2019   Nonallopathic lesion of lumbosacral region 05/16/2019   Nonallopathic lesion of sacral region 05/16/2019   Nonallopathic lesion of thoracic region 05/16/2019   Lateral epicondylitis of right elbow 04/05/2019   Greater trochanteric bursitis of right hip 04/05/2019   Hashimoto's thyroiditis 05/10/2016   PCOS (polycystic ovarian syndrome) 03/27/2015   Irregular menses 03/27/2015    Allergies:  Allergies  Allergen Reactions   Sulfa Antibiotics Other (See Comments)   Medications:  Current  Outpatient Medications:    nirmatrelvir/ritonavir EUA (PAXLOVID) 20 x 150 MG & 10 x $Re'100MG'xpE$  TABS, Take 3 tablets by mouth 2 (two) times daily for 5 days. (Take nirmatrelvir 150 mg two tablets twice daily for 5 days and ritonavir 100 mg one tablet twice daily for 5 days) Patient GFR is 67, Disp: 30 tablet,  Rfl: 0   albuterol (VENTOLIN HFA) 108 (90 Base) MCG/ACT inhaler, Inhale 1-2 puffs into the lungs every 6 (six) hours as needed for wheezing or shortness of breath. And before physical activity, Disp: 8.5 g, Rfl: 0   budesonide-formoterol (SYMBICORT) 80-4.5 MCG/ACT inhaler, Inhale 2 puffs into the lungs 2 (two) times daily., Disp: 10.2 g, Rfl: 0   COVID-19 At Home Antigen Test (CARESTART COVID-19 HOME TEST) KIT, Use as directed, Disp: 2 kit, Rfl: 0   Diclofenac Sodium (PENNSAID) 2 % SOLN, Place 2 g onto the skin 2 (two) times daily., Disp: 112 g, Rfl: 3   Diclofenac Sodium 2 % SOLN, Place 2 g onto the skin 2 (two) times daily., Disp: 112 g, Rfl: 3   estradiol (VIVELLE-DOT) 0.0375 MG/24HR, Place 1 patch onto the skin and change twice weekly, Disp: 8 patch, Rfl: 5   estradiol (VIVELLE-DOT) 0.0375 MG/24HR, Change patch twice weekly, Disp: 24 patch, Rfl: 1   estradiol (VIVELLE-DOT) 0.05 MG/24HR patch, APPLY 1 PATCH AND CHANGE TWICE WEEKLY, Disp: 8 patch, Rfl: 5   estradiol (VIVELLE-DOT) 0.05 MG/24HR patch, APPLY PATCH TWICE WEEKLY, Disp: 8 patch, Rfl: 3   estradiol (VIVELLE-DOT) 0.05 MG/24HR patch, Change patch twice weekly, Disp: 24 patch, Rfl: 1   fluconazole (DIFLUCAN) 150 MG tablet, Take 1 tablet (150 mg total) by mouth once a week., Disp: 4 tablet, Rfl: 0   gabapentin (NEURONTIN) 100 MG capsule, Take 2 capsules (200 mg total) by mouth at bedtime., Disp: 180 capsule, Rfl: 0   levothyroxine (SYNTHROID) 50 MCG tablet, TAKE 1 TABLET BY MOUTH ONCE DAILY ON EMPTY STOMACH, Disp: 90 tablet, Rfl: 1   meloxicam (MOBIC) 7.5 MG tablet, Take 1 tablet (7.5 mg total) by mouth daily., Disp: 30 tablet, Rfl: 0   miconazole (MICATIN) 2 % cream, Apply 1 application topically 2 (two) times daily., Disp: 28.35 g, Rfl: 1   nitroGLYCERIN (NITRO-DUR) 0.2 mg/hr patch, Apply 1/4 of a patch to skin once daily., Disp: 30 patch, Rfl: 0   oseltamivir (TAMIFLU) 75 MG capsule, Take 1 capsule (75 mg total) by mouth 2 (two) times  daily., Disp: 10 capsule, Rfl: 0   progesterone (PROMETRIUM) 100 MG capsule, Take 1 capsule (100 mg total) by mouth at bedtime, Disp: 90 capsule, Rfl: 3   Progesterone Micronized (PROGESTERONE PO), Take by mouth. Take on days 12-26 through of cycle, Disp: , Rfl:    SYNTHROID 50 MCG tablet, , Disp: , Rfl: 1   SYNTHROID 50 MCG tablet, TAKE 1 TABLET BY MOUTH ONCE DAILY ON AN EMPTY STOMACH, Disp: 90 tablet, Rfl: 1  Observations/Objective: Patient is well-developed, well-nourished in no acute distress.  Resting comfortably at home.  Head is normocephalic, atraumatic.  No labored breathing.  Speech is clear and coherent with logical content.  Patient is alert and oriented at baseline.    Assessment and Plan: 1. COVID-19 - nirmatrelvir/ritonavir EUA (PAXLOVID) 20 x 150 MG & 10 x $Re'100MG'NBH$  TABS; Take 3 tablets by mouth 2 (two) times daily for 5 days. (Take nirmatrelvir 150 mg two tablets twice daily for 5 days and ritonavir 100 mg one tablet twice daily for 5 days) Patient GFR is  67  Dispense: 30 tablet; Refill: 0  - Continue OTC symptomatic management of choice - Will send OTC vitamins and supplement information through AVS - Paxlovid prescribed - Patient enrolled in MyChart symptom monitoring - Push fluids - Rest as needed - Discussed return precautions and when to seek in-person evaluation, sent via AVS as well   Follow Up Instructions: I discussed the assessment and treatment plan with the patient. The patient was provided an opportunity to ask questions and all were answered. The patient agreed with the plan and demonstrated an understanding of the instructions.  A copy of instructions were sent to the patient via MyChart unless otherwise noted below.    The patient was advised to call back or seek an in-person evaluation if the symptoms worsen or if the condition fails to improve as anticipated.  Time:  I spent 13 minutes with the patient via telehealth technology discussing the above  problems/concerns.    Mar Daring, PA-C

## 2022-04-13 DIAGNOSIS — E039 Hypothyroidism, unspecified: Secondary | ICD-10-CM | POA: Diagnosis not present

## 2022-04-13 DIAGNOSIS — Z0000 Encounter for general adult medical examination without abnormal findings: Secondary | ICD-10-CM | POA: Diagnosis not present

## 2022-04-13 DIAGNOSIS — U071 COVID-19: Secondary | ICD-10-CM | POA: Diagnosis not present

## 2022-04-13 DIAGNOSIS — J45990 Exercise induced bronchospasm: Secondary | ICD-10-CM | POA: Diagnosis not present

## 2022-04-15 ENCOUNTER — Other Ambulatory Visit: Payer: Self-pay

## 2022-04-15 ENCOUNTER — Encounter: Payer: Self-pay | Admitting: Pharmacist

## 2022-04-15 MED ORDER — SYNTHROID 50 MCG PO TABS
ORAL_TABLET | ORAL | 1 refills | Status: AC
  Filled 2022-04-15: qty 90, 90d supply, fill #0
  Filled 2022-07-15: qty 90, 90d supply, fill #1

## 2022-04-18 ENCOUNTER — Other Ambulatory Visit: Payer: Self-pay

## 2022-04-22 NOTE — Progress Notes (Unsigned)
Stacey Parker Stacey Parker Stacey Parker: [phone] Subjective:   [name], am serving as a scribe for Dr. Hulan Saas. I'm seeing this patient by the request  of:  Patient, No Pcp Per  CC: Back and neck pain follow-up  CNO:[id number]  Stacey Parker is a 51 y.o. female coming in with complaint of back and neck pain. OMT 03/01/2022. Patient states that her R side of her body feel worse. Wonders if she is hypermobile and this is causing pain throughout the entire right side from her neck to her hip and back of her R leg. Patient does try to release R psoas and hip flexor which helps. Stopped going to [place] but she still has tight hip flexors.   Saw an ad for a hip hook and wants to know   Medications patient has been prescribed: None  Taking:         Reviewed prior external information including notes and imaging from previsou exam, outside providers and external EMR if available.   As well as notes that were available from care everywhere and other healthcare systems.  Past medical history, social, surgical and family history all reviewed in electronic medical record.  No pertanent information unless stated regarding to the chief complaint.   Past Medical History:  Diagnosis Date   Hashimoto's thyroiditis    Polycystic ovary     Allergies  Allergen Reactions   Sulfa Antibiotics Other (See Comments)     Review of Systems:  No headache, visual changes, nausea, vomiting, diarrhea, constipation, dizziness, abdominal pain, skin rash, fevers, chills, night sweats, weight loss, swollen lymph nodes, body aches, joint swelling, chest pain, shortness of breath, mood changes. POSITIVE muscle aches  Objective  Blood pressure 104/76, pulse 60, height 5\' 4"  (1.626 m), weight 150 lb (68 kg), SpO2 97 %.   General: No apparent distress alert and oriented x3 mood and affect normal, dressed appropriately.  HEENT: Pupils equal,  extraocular movements intact  Respiratory: Patient's speak in full sentences and does not appear short of breath  Cardiovascular: No lower extremity edema, non tender, no erythema  Gait normal  MSK:  Back does have some loss of lordosis.  Some tenderness to palpation of seeming to be on the right side of the neck, mid back, and lower back.  Tightness of the right psoas  Osteopathic findings  C2 flexed rotated and side bent right C6 flexed rotated and side bent right  T3 extended rotated and side bent right inhaled rib L1 flexed rotated and side bent right Sacrum right on right    Assessment and Plan:  Cervical pain (neck) Continue tightness noted.  Does respond well to osteopathic manipulation.  He does have stress as well as ergonomics plays a role.  Discussed different medications again. This point we will hold.  He does have the meloxicam as needed for breakthrough.  Discussed posture and ergonomics otherwise.  Follow-up again in 6 to 8 weeks.    Nonallopathic problems  Decision today to treat with OMT was based on Physical Exam  After verbal consent patient was treated with HVLA, ME, FPR techniques in cervical, rib, thoracic, lumbar, and sacral  areas  Patient tolerated the procedure well with improvement in symptoms  Patient given exercises, stretches and lifestyle modifications  See medications in patient instructions if given  Patient will follow up in 4-8 weeks      The above documentation has been reviewed and is  accurate and complete Lyndal Pulley, DO         Note: This dictation was prepared with Dragon dictation along with smaller phrase technology. Any transcriptional errors that result from this process are unintentional.

## 2022-04-25 ENCOUNTER — Encounter: Payer: Self-pay | Admitting: Family Medicine

## 2022-04-25 ENCOUNTER — Ambulatory Visit: Payer: 59 | Admitting: Family Medicine

## 2022-04-25 VITALS — BP 104/76 | HR 60 | Ht 64.0 in | Wt 150.0 lb

## 2022-04-25 DIAGNOSIS — M542 Cervicalgia: Secondary | ICD-10-CM | POA: Diagnosis not present

## 2022-04-25 DIAGNOSIS — M9904 Segmental and somatic dysfunction of sacral region: Secondary | ICD-10-CM

## 2022-04-25 DIAGNOSIS — M9903 Segmental and somatic dysfunction of lumbar region: Secondary | ICD-10-CM

## 2022-04-25 DIAGNOSIS — M9908 Segmental and somatic dysfunction of rib cage: Secondary | ICD-10-CM | POA: Diagnosis not present

## 2022-04-25 DIAGNOSIS — M9902 Segmental and somatic dysfunction of thoracic region: Secondary | ICD-10-CM | POA: Diagnosis not present

## 2022-04-25 DIAGNOSIS — M9901 Segmental and somatic dysfunction of cervical region: Secondary | ICD-10-CM | POA: Diagnosis not present

## 2022-04-25 NOTE — Assessment & Plan Note (Signed)
Continue tightness noted.  Does respond well to osteopathic manipulation.  He does have stress as well as ergonomics plays a role.  Discussed different medications again. This point we will hold.  He does have the meloxicam as needed for breakthrough.  Discussed posture and ergonomics otherwise.  Follow-up again in 6 to 8 weeks.

## 2022-04-25 NOTE — Patient Instructions (Signed)
Great to see you See me in  7-8 weeks

## 2022-05-09 ENCOUNTER — Other Ambulatory Visit: Payer: Self-pay

## 2022-05-18 ENCOUNTER — Other Ambulatory Visit: Payer: Self-pay

## 2022-05-19 ENCOUNTER — Other Ambulatory Visit: Payer: Self-pay

## 2022-05-20 ENCOUNTER — Other Ambulatory Visit: Payer: Self-pay

## 2022-05-20 MED ORDER — ALBUTEROL SULFATE HFA 108 (90 BASE) MCG/ACT IN AERS
INHALATION_SPRAY | RESPIRATORY_TRACT | 0 refills | Status: AC
  Filled 2022-05-20: qty 6.7, 25d supply, fill #0

## 2022-05-27 ENCOUNTER — Other Ambulatory Visit: Payer: Self-pay

## 2022-05-27 MED ORDER — FLUARIX QUADRIVALENT 0.5 ML IM SUSY
PREFILLED_SYRINGE | INTRAMUSCULAR | 0 refills | Status: AC
  Filled 2022-05-27: qty 0.5, 1d supply, fill #0

## 2022-06-09 NOTE — Progress Notes (Unsigned)
[name] [address] [place] [address] Phone: [phone] Subjective:   [name], am serving as a scribe for Dr. Antoine Primas.  I'm seeing this patient by the request  of:  Patient, No Pcp Per  CC: Back pain, neck pain, elbow pain follow-up  HEN:[id number]  Stacey Parker is a 51 y.o. female coming in with complaint of back and neck pain. OMT 04/25/2022. Patient states that she purchased hip hook and this has helped muscular pain.   Pain in L elbow comes back with activity. Has tried to slowly introduce exercise but feels unable to do resistance training without pain.   Strained L piriformis since last visit. 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 Pain in this area has subsided.   Medications patient has been prescribed: None  Taking:         Reviewed prior external information including notes and imaging from previsou exam, outside providers and external EMR if available.   As well as notes that were available from care everywhere and other healthcare systems.  Past medical history, social, surgical and family history all reviewed in electronic medical record.  No pertanent information unless stated regarding to the chief complaint.   Past Medical History:  Diagnosis Date   Hashimoto's thyroiditis    Polycystic ovary     Allergies  Allergen Reactions   Sulfa Antibiotics Other (See Comments)     Review of Systems:  No headache, visual changes, nausea, vomiting, diarrhea, constipation, dizziness, abdominal pain, skin rash, fevers, chills, night sweats, weight loss, swollen lymph nodes, body aches, joint swelling, chest pain, shortness of breath, mood changes. POSITIVE muscle aches  Objective  Blood pressure 102/76, pulse 68, height 5\' 4"  (1.626 m), weight 155 lb (70.3 kg), SpO2 98 %.   General: No apparent distress alert and oriented x3 mood and affect normal, dressed  appropriately.  HEENT: Pupils equal, extraocular movements intact  Respiratory: Patient's speak in full sentences and does not appear short of breath  Cardiovascular: No lower extremity edema, non tender, no erythema  Low back does have some loss of lordosis.  Some tenderness to palpation of the paraspinal musculature.  Neck exam does have some limited sidebending bilaterally.  Left elbow does have tenderness more over the tricep tendon than truly over the lateral epicondylar region.  Mild pain with resisted extension of the elbow.  Osteopathic findings  C2 flexed rotated and side bent right C5 flexed rotated and side bent left T3 extended rotated and side bent left inhaled rib T9 extended rotated and side bent left inhaled rib L4 flexed rotated and side bent left Sacrum right on right       Assessment and Plan:  Cervical pain (neck) Continues some tightness noted.  Still think it is more posture and ergonomics.  Continue to stay active though at this time.  Discussed posture and ergonomics, discussed continuing to stay strong.  Follow-up with me again in 6 to 8 weeks.    Nonallopathic problems  Decision today to treat with OMT was based on Physical Exam  After verbal consent patient was treated with HVLA, ME, FPR techniques in cervical, rib, thoracic, lumbar, and sacral  areas  Patient tolerated the procedure well with improvement in symptoms  Patient given exercises, stretches and lifestyle modifications  See medications in patient instructions if given  Patient will follow up in 4-8 weeks     The above documentation  has been reviewed and is accurate and complete Lyndal Pulley, DO         Note: This dictation was prepared with Dragon dictation along with smaller phrase technology. Any transcriptional errors that result from this process are unintentional.

## 2022-06-14 ENCOUNTER — Ambulatory Visit: Payer: 59 | Admitting: Family Medicine

## 2022-06-14 VITALS — BP 102/76 | HR 68 | Ht 64.0 in | Wt 155.0 lb

## 2022-06-14 DIAGNOSIS — M9908 Segmental and somatic dysfunction of rib cage: Secondary | ICD-10-CM

## 2022-06-14 DIAGNOSIS — M9902 Segmental and somatic dysfunction of thoracic region: Secondary | ICD-10-CM | POA: Diagnosis not present

## 2022-06-14 DIAGNOSIS — M9901 Segmental and somatic dysfunction of cervical region: Secondary | ICD-10-CM

## 2022-06-14 DIAGNOSIS — M542 Cervicalgia: Secondary | ICD-10-CM

## 2022-06-14 DIAGNOSIS — M9904 Segmental and somatic dysfunction of sacral region: Secondary | ICD-10-CM

## 2022-06-14 DIAGNOSIS — M9903 Segmental and somatic dysfunction of lumbar region: Secondary | ICD-10-CM | POA: Diagnosis not present

## 2022-06-14 NOTE — Patient Instructions (Signed)
Good to see you  Drive safe and have a drink for me  Follow up in 7-8 weeks

## 2022-06-15 ENCOUNTER — Encounter: Payer: Self-pay | Admitting: Family Medicine

## 2022-06-15 NOTE — Assessment & Plan Note (Signed)
Continues some tightness noted.  Still think it is more posture and ergonomics.  Continue to stay active though at this time.  Discussed posture and ergonomics, discussed continuing to stay strong.  Follow-up with me again in 6 to 8 weeks.

## 2022-06-22 DIAGNOSIS — E282 Polycystic ovarian syndrome: Secondary | ICD-10-CM | POA: Diagnosis not present

## 2022-06-22 DIAGNOSIS — E063 Autoimmune thyroiditis: Secondary | ICD-10-CM | POA: Diagnosis not present

## 2022-07-01 ENCOUNTER — Other Ambulatory Visit: Payer: Self-pay

## 2022-07-15 ENCOUNTER — Other Ambulatory Visit: Payer: Self-pay

## 2022-07-15 ENCOUNTER — Other Ambulatory Visit: Payer: Self-pay | Admitting: Family Medicine

## 2022-07-17 ENCOUNTER — Other Ambulatory Visit: Payer: Self-pay | Admitting: Family Medicine

## 2022-07-17 ENCOUNTER — Other Ambulatory Visit: Payer: Self-pay

## 2022-07-18 ENCOUNTER — Other Ambulatory Visit: Payer: Self-pay

## 2022-07-18 MED FILL — Gabapentin Cap 100 MG: ORAL | 90 days supply | Qty: 180 | Fill #0 | Status: AC

## 2022-07-21 ENCOUNTER — Other Ambulatory Visit: Payer: Self-pay

## 2022-07-28 NOTE — Progress Notes (Signed)
[name] [address] [place] [address] Phone: [phone] Subjective:   Stacey Parker, am serving as a scribe for Dr. Antoine Primas.  I'm seeing this patient by the request  of:  Patient, No Pcp Per  CC: Neck and back pain follow-up  CWC:[id number]  Stacey Parker is a 51 y.o. female coming in with complaint of back and neck pain. OMT 06/14/2022.  Patient not been quite as active as usual.  Having more discomfort and pain noted on a regular basis with tightness.  We did do a significant amount of driving during the holidays and potentially could have contributed to some of it as well.  Medications patient has been prescribed: None  Taking:         Reviewed prior external information including notes and imaging from previsou exam, outside providers and external EMR if available.   As well as notes that were available from care everywhere and other healthcare systems.  Past medical history, social, surgical and family history all reviewed in electronic medical record.  No pertanent information unless stated regarding to the chief complaint.   Past Medical History:  Diagnosis Date   Hashimoto's thyroiditis    Polycystic ovary     Allergies  Allergen Reactions   Sulfa Antibiotics Other (See Comments)     Review of Systems:  No headache, visual changes, nausea, vomiting, diarrhea, constipation, dizziness, abdominal pain, skin rash, fevers, chills, night sweats, weight loss, swollen lymph nodes, body aches, joint swelling, chest pain, shortness of breath, mood changes. POSITIVE muscle aches  Objective  Blood pressure 118/80, pulse 62, height 5\' 4"  (1.626 m), weight 157 lb (71.2 kg), SpO2 99 %.   General: No apparent distress alert and oriented x3 mood and affect normal, dressed appropriately.  HEENT: Pupils equal, extraocular movements intact  Respiratory: Patient's speak in full sentences and does not appear short of  breath  Cardiovascular: No lower extremity edema, non tender, no erythema  Neck exam does have some mild loss lordosis.  Some tightness with sidebending right greater than left.  Tightness of the right parascapular region as well.  Tightness in the thoracolumbar junction on the right side as well.  Osteopathic findings  C2 flexed rotated and side bent right C6 flexed rotated and side bent left T3 extended rotated and side bent right inhaled rib L1 flexed rotated and side bent right Sacrum right on right       Assessment and Plan:  Low back pain Tightness of the right side of the back.  Describes the pain as a dull, throbbing aching sensation.  Patient has been not quite as active as usual.  Will start doing the exercises more regularly and doing the osteopathic manipulation also seems to be helpful.  Follow-up again in 6 to 8 weeks otherwise.    Nonallopathic problems  Decision today to treat with OMT was based on Physical Exam  After verbal consent patient was treated with HVLA, ME, FPR techniques in cervical, rib, thoracic, lumbar, and sacral  areas  Patient tolerated the procedure well with improvement in symptoms  Patient given exercises, stretches and lifestyle modifications  See medications in patient instructions if given  Patient will follow up in 4-8 weeks    The above documentation has been reviewed and is accurate and complete , DO          Note: This dictation was prepared with Dragon dictation along with smaller phrase technology. Any transcriptional  errors that result from this process are unintentional.

## 2022-08-03 ENCOUNTER — Ambulatory Visit (INDEPENDENT_AMBULATORY_CARE_PROVIDER_SITE_OTHER): Payer: Commercial Managed Care - PPO | Admitting: Family Medicine

## 2022-08-03 VITALS — BP 118/80 | HR 62 | Ht 64.0 in | Wt 157.0 lb

## 2022-08-03 DIAGNOSIS — M9902 Segmental and somatic dysfunction of thoracic region: Secondary | ICD-10-CM

## 2022-08-03 DIAGNOSIS — M9901 Segmental and somatic dysfunction of cervical region: Secondary | ICD-10-CM

## 2022-08-03 DIAGNOSIS — G8929 Other chronic pain: Secondary | ICD-10-CM | POA: Diagnosis not present

## 2022-08-03 DIAGNOSIS — M9908 Segmental and somatic dysfunction of rib cage: Secondary | ICD-10-CM

## 2022-08-03 DIAGNOSIS — M9903 Segmental and somatic dysfunction of lumbar region: Secondary | ICD-10-CM

## 2022-08-03 DIAGNOSIS — M545 Low back pain, unspecified: Secondary | ICD-10-CM

## 2022-08-03 DIAGNOSIS — M9904 Segmental and somatic dysfunction of sacral region: Secondary | ICD-10-CM

## 2022-08-03 NOTE — Patient Instructions (Addendum)
Good to see you  Happy new year  Follow up in 6-8 weeks

## 2022-08-03 NOTE — Assessment & Plan Note (Signed)
Tightness of the right side of the back.  Describes the pain as a dull, throbbing aching sensation.  Patient has been not quite as active as usual.  Will start doing the exercises more regularly and doing the osteopathic manipulation also seems to be helpful.  Follow-up again in 6 to 8 weeks otherwise.

## 2022-08-16 ENCOUNTER — Other Ambulatory Visit: Payer: Self-pay

## 2022-08-16 MED ORDER — ESTRADIOL 0.05 MG/24HR TD PTTW
1.0000 | MEDICATED_PATCH | TRANSDERMAL | 1 refills | Status: AC
  Filled 2022-08-16: qty 24, 84d supply, fill #0
  Filled 2022-10-31: qty 24, 84d supply, fill #1

## 2022-08-17 ENCOUNTER — Other Ambulatory Visit: Payer: Self-pay

## 2022-09-14 NOTE — Progress Notes (Signed)
[name] [address] [place] [address] Phone: [phone] Subjective:   Stacey Parker, am serving as a scribe for Dr. Antoine Primas.  I'm seeing this patient by the request  of:  Patient, No Pcp Per  CC: New problem ankle pain, follow-up for neck pain  GUY:[id number]  Stacey Parker is a 52 y.o. female coming in with complaint of back and neck pain. OMT 08/03/2022. Patient states that her neck is tight in the traps. Continues to use gabapentin which helps her elbow pain. Trying to do heavier strength training exercises. Does not do alo to of tricep work.   Also c/o B achilles L>R pain that is persisting. Hurts when she plays tennis but not with walking or in the gym. Does stretch gastroc and soleus.   Medications patient has been prescribed: Gabapentin   Taking: Intermittently         Reviewed prior external information including notes and imaging from previsou exam, outside providers and external EMR if available.   As well as notes that were available from care everywhere and other healthcare systems.  Past medical history, social, surgical and family history all reviewed in electronic medical record.  No pertanent information unless stated regarding to the chief complaint.   Past Medical History:  Diagnosis Date   Hashimoto's thyroiditis    Polycystic ovary     Allergies  Allergen Reactions   Sulfa Antibiotics Other (See Comments)     Review of Systems:  No headache, visual changes, nausea, vomiting, diarrhea, constipation, dizziness, abdominal pain, skin rash, fevers, chills, night sweats, weight loss, swollen lymph nodes, body aches, joint swelling, chest pain, shortness of breath, mood changes. POSITIVE muscle aches  Objective  Blood pressure 104/68, pulse 72, height 5\' 4"  (1.626 m), weight 151 lb (68.5 kg), SpO2 97 %.   General: No apparent distress alert and oriented x3 mood and affect normal, dressed  appropriately.  HEENT: Pupils equal, extraocular movements intact  Respiratory: Patient's speak in full sentences and does not appear short of breath  Cardiovascular: No lower extremity edema, non tender, no erythema  \Patient right ankle does have some thickening of the Achilles but full range of motion.  Tender to palpation over the left Achilles with mild thickening as well approximately 2 cm from insertion.  No Haglund nodule noted.  Patient does have pain in the retrocalcaneal area  Limited muscular skeletal ultrasound was performed and interpreted by Antoine Primas, M  Limited ultrasound does show hypoechoic changes in the retrocalcaneal area is consistent with a bursitis.  No cortical or tearing noted in the Achilles.  Osteopathic findings  C7 flexed rotated and side bent left T3 extended rotated and side bent right inhaled rib T9 extended rotated and side bent left L2 flexed rotated and side bent right Sacrum right on right       Assessment and Plan:  Retrocalcaneal bursitis (back of heel), left Patient has some mild thickening of the Achilles but on ultrasound no significant hypoechoic changes or anything that seems to be more of an increase concern for rupture.  Does have significant though retrocalcaneal bursitis noted.  Discussed with patient about icing regimen and home exercises otherwise, which activities to do and which ones to avoid, increase activity slowly.  Follow-up with me again in 6 to 8 weeks otherwise.  Discussed heel lift as well and proper shoes  Cervical pain (neck) Continues to have tightness that is multifactorial.  Seems to be more  secondary to stress, posture, ergonomics throughout the day.  Follow-up with me again in 6 to 8 weeks    Nonallopathic problems  Decision today to treat with OMT was based on Physical Exam  After verbal consent patient was treated with HVLA, ME, FPR techniques in cervical, rib, thoracic, lumbar, and sacral  areas  Patient  tolerated the procedure well with improvement in symptoms  Patient given exercises, stretches and lifestyle modifications  See medications in patient instructions if given  Patient will follow up in 4-8 weeks      The above documentation has been reviewed and is accurate and complete Stacey Saa, DO        Note: This dictation was prepared with Dragon dictation along with smaller phrase technology. Any transcriptional errors that result from this process are unintentional.

## 2022-09-15 ENCOUNTER — Ambulatory Visit: Payer: Commercial Managed Care - PPO | Admitting: Family Medicine

## 2022-09-15 ENCOUNTER — Ambulatory Visit: Payer: Self-pay

## 2022-09-15 VITALS — BP 104/68 | HR 72 | Ht 64.0 in | Wt 151.0 lb

## 2022-09-15 DIAGNOSIS — M9908 Segmental and somatic dysfunction of rib cage: Secondary | ICD-10-CM

## 2022-09-15 DIAGNOSIS — M9903 Segmental and somatic dysfunction of lumbar region: Secondary | ICD-10-CM | POA: Diagnosis not present

## 2022-09-15 DIAGNOSIS — M542 Cervicalgia: Secondary | ICD-10-CM

## 2022-09-15 DIAGNOSIS — M9901 Segmental and somatic dysfunction of cervical region: Secondary | ICD-10-CM

## 2022-09-15 DIAGNOSIS — M9902 Segmental and somatic dysfunction of thoracic region: Secondary | ICD-10-CM

## 2022-09-15 DIAGNOSIS — M25572 Pain in left ankle and joints of left foot: Secondary | ICD-10-CM

## 2022-09-15 DIAGNOSIS — M9904 Segmental and somatic dysfunction of sacral region: Secondary | ICD-10-CM | POA: Diagnosis not present

## 2022-09-15 DIAGNOSIS — M7752 Other enthesopathy of left foot: Secondary | ICD-10-CM

## 2022-09-15 NOTE — Assessment & Plan Note (Signed)
Patient has some mild thickening of the Achilles but on ultrasound no significant hypoechoic changes or anything that seems to be more of an increase concern for rupture.  Does have significant though retrocalcaneal bursitis noted.  Discussed with patient about icing regimen and home exercises otherwise, which activities to do and which ones to avoid, increase activity slowly.  Follow-up with me again in 6 to 8 weeks otherwise.  Discussed heel lift as well and proper shoes

## 2022-09-15 NOTE — Assessment & Plan Note (Signed)
Continues to have tightness that is multifactorial.  Seems to be more secondary to stress, posture, ergonomics throughout the day.  Follow-up with me again in 6 to 8 weeks

## 2022-09-15 NOTE — Patient Instructions (Signed)
Exercises Ice after activity See me in 6-8 weeks

## 2022-09-18 ENCOUNTER — Encounter: Payer: Self-pay | Admitting: Family Medicine

## 2022-09-26 DIAGNOSIS — H524 Presbyopia: Secondary | ICD-10-CM | POA: Diagnosis not present

## 2022-09-27 ENCOUNTER — Other Ambulatory Visit: Payer: Self-pay | Admitting: Family Medicine

## 2022-09-27 ENCOUNTER — Other Ambulatory Visit: Payer: Self-pay

## 2022-09-27 MED ORDER — LEVOTHYROXINE SODIUM 50 MCG PO TABS
50.0000 ug | ORAL_TABLET | Freq: Every day | ORAL | 1 refills | Status: DC
  Filled 2022-09-27: qty 90, 90d supply, fill #0
  Filled 2023-01-13 (×2): qty 90, 90d supply, fill #1

## 2022-09-27 MED ORDER — ALBUTEROL SULFATE HFA 108 (90 BASE) MCG/ACT IN AERS
INHALATION_SPRAY | RESPIRATORY_TRACT | 2 refills | Status: AC
  Filled 2022-09-27: qty 6.7, 25d supply, fill #0

## 2022-09-27 MED FILL — Gabapentin Cap 100 MG: ORAL | 90 days supply | Qty: 180 | Fill #0 | Status: AC

## 2022-09-28 ENCOUNTER — Other Ambulatory Visit: Payer: Self-pay

## 2022-10-31 ENCOUNTER — Other Ambulatory Visit: Payer: Self-pay

## 2022-11-04 NOTE — Progress Notes (Signed)
[name] [address] [place] [address] Phone: [phone] Subjective:   [name], am serving as a scribe for Dr. Antoine Primas.  I'm seeing this patient by the request  of:  Patient, No Pcp Per  CC: Left ankle pain, neck pain and back pain  ONG:[id number]  09/15/2022 Patient has some mild thickening of the Achilles but on ultrasound no significant hypoechoic changes or anything that seems to be more of an increase concern for rupture. Does have significant though retrocalcaneal bursitis noted. Discussed with patient about icing regimen and home exercises otherwise, which activities to do and which ones to avoid, increase activity slowly. Follow-up with me again in 6 to 8 weeks otherwise. Discussed heel lift as well and proper shoes   Continues to have tightness that is multifactorial.  Seems to be more secondary to stress, posture, ergonomics throughout the day.  Follow-up with me again in 6 to 8 weeks      Update 11/07/2022 KAWANDA BURLINGHAM is a 52 y.o. female coming in with complaint of back and neck pain. OMT 09/15/2022. Also f/u for L heel pain seemed to be more achilles . Patient states that her L heel pain is not improving. Pain worse when she takes a brake between sets and after activity. Ices after she plays. KT taping helps when playing. Continue swelling. Want to know if dry needling would be helpful in the calf to loosen the achilles.   Patient has been doing a lot of driving and she notes tighntess in R hip to her cspine.     Medications patient has been prescribed: None  Taking:         Reviewed prior external information including notes and imaging from previsou exam, outside providers and external EMR if available.   As well as notes that were available from care everywhere and other healthcare systems.  Past medical history, social, surgical and family history all reviewed in electronic medical record.  No  pertanent information unless stated regarding to the chief complaint.   Past Medical History:  Diagnosis Date   Hashimoto's thyroiditis    Polycystic ovary     Allergies  Allergen Reactions   Sulfa Antibiotics Other (See Comments)     Review of Systems:  No headache, visual changes, nausea, vomiting, diarrhea, constipation, dizziness, abdominal pain, skin rash, fevers, chills, night sweats, weight loss, swollen lymph nodes, body aches, joint swelling, chest pain, shortness of breath, mood changes. POSITIVE muscle aches  Objective  Blood pressure 100/72, pulse 60, height 5\' 4"  (1.626 m), weight 153 lb (69.4 kg), SpO2 99 %.   General: No apparent distress alert and oriented x3 mood and affect normal, dressed appropriately.  HEENT: Pupils equal, extraocular movements intact  Respiratory: Patient's speak in full sentences and does not appear short of breath  Cardiovascular: No lower extremity edema, non tender, no erythema  Left ankle does have some inflammation and TTP in the mid substance of achilles patient does have full range of motion of the ankle noted.  No pain in the retrocalcaneal area where patient had pain previously.  Osteopathic findings  C2 flexed rotated and side bent right C6 flexed rotated and side bent left T3 extended rotated and side bent right inhaled rib T9 extended rotated and side bent left L2 flexed rotated and side bent right Sacrum right on right   Limited muscular skeletal ultrasound was performed and interpreted by Antoine Primas, M   Patient's Achilles tendon  does have some hypoechoic changes noted as well.  Patient does not have any true tearing noted.  No increase in Doppler flow. Impression: Chronic tendinopathy     Assessment and Plan:  Achilles tendinosis of left ankle Glycerin noted and prescribed today, discussed heel lift, CAM Walker, patient has not responded well to conservative therapy.  Follow-up with me again 6 to 8 weeks  Cervical  pain (neck) Chronic problem, discussed posture and ergonomics time.  Does respond extremely well to osteopathic manipulation.  We discussed patient's work environment.  Will be traveling for some time as well.  Increase activity slowly.  Follow-up again in 6 to 8 weeks.    Nonallopathic problems  Decision today to treat with OMT was based on Physical Exam  After verbal consent patient was treated with HVLA, ME, FPR techniques in cervical, rib, thoracic, lumbar, and sacral  areas  Patient tolerated the procedure well with improvement in symptoms  Patient given exercises, stretches and lifestyle modifications  See medications in patient instructions if given  Patient will follow up in 4-8 weeks    The above documentation has been reviewed and is accurate and complete Judi Saa, DO          Note: This dictation was prepared with Dragon dictation along with smaller phrase technology. Any transcriptional errors that result from this process are unintentional.

## 2022-11-07 ENCOUNTER — Other Ambulatory Visit: Payer: Self-pay

## 2022-11-07 ENCOUNTER — Encounter: Payer: Self-pay | Admitting: Family Medicine

## 2022-11-07 ENCOUNTER — Ambulatory Visit: Payer: Commercial Managed Care - PPO | Admitting: Family Medicine

## 2022-11-07 VITALS — BP 100/72 | HR 60 | Ht 64.0 in | Wt 153.0 lb

## 2022-11-07 DIAGNOSIS — M25572 Pain in left ankle and joints of left foot: Secondary | ICD-10-CM | POA: Diagnosis not present

## 2022-11-07 DIAGNOSIS — M67874 Other specified disorders of tendon, left ankle and foot: Secondary | ICD-10-CM | POA: Diagnosis not present

## 2022-11-07 DIAGNOSIS — M542 Cervicalgia: Secondary | ICD-10-CM

## 2022-11-07 DIAGNOSIS — M9902 Segmental and somatic dysfunction of thoracic region: Secondary | ICD-10-CM

## 2022-11-07 DIAGNOSIS — M9908 Segmental and somatic dysfunction of rib cage: Secondary | ICD-10-CM

## 2022-11-07 DIAGNOSIS — M9903 Segmental and somatic dysfunction of lumbar region: Secondary | ICD-10-CM

## 2022-11-07 DIAGNOSIS — M9901 Segmental and somatic dysfunction of cervical region: Secondary | ICD-10-CM

## 2022-11-07 DIAGNOSIS — M79672 Pain in left foot: Secondary | ICD-10-CM

## 2022-11-07 DIAGNOSIS — M9904 Segmental and somatic dysfunction of sacral region: Secondary | ICD-10-CM | POA: Diagnosis not present

## 2022-11-07 DIAGNOSIS — M7752 Other enthesopathy of left foot: Secondary | ICD-10-CM | POA: Diagnosis not present

## 2022-11-07 MED ORDER — NITROGLYCERIN 0.2 MG/HR TD PT24
MEDICATED_PATCH | TRANSDERMAL | 0 refills | Status: AC
  Filled 2022-11-07: qty 30, 30d supply, fill #0

## 2022-11-07 NOTE — Assessment & Plan Note (Signed)
Glycerin noted and prescribed today, discussed heel lift, CAM Walker, patient has not responded well to conservative therapy.  Follow-up with me again 6 to 8 weeks

## 2022-11-07 NOTE — Assessment & Plan Note (Signed)
Chronic problem, discussed posture and ergonomics time.  Does respond extremely well to osteopathic manipulation.  We discussed patient's work environment.  Will be traveling for some time as well.  Increase activity slowly.  Follow-up again in 6 to 8 weeks.

## 2022-11-07 NOTE — Patient Instructions (Signed)
Nitroglycerin Protocol   Apply 1/4 nitroglycerin patch to affected area daily.  Change position of patch within the affected area every 24 hours.  You may experience a headache during the first 1-2 weeks of using the patch, these should subside.  If you experience headaches after beginning nitroglycerin patch treatment, you may take your preferred over the counter pain reliever.  Another side effect of the nitroglycerin patch is skin irritation or rash related to patch adhesive.  Please notify our office if you develop more severe headaches or rash, and stop the patch.  Tendon healing with nitroglycerin patch may require 12 to 24 weeks depending on the extent of injury.  Men should not use if taking Viagra, Cialis, or Levitra.   Do not use if you have migraines or rosacea.  Keep doing exercises No tennis for 2 weeks See me again in 6 weeks

## 2022-11-18 DIAGNOSIS — M9902 Segmental and somatic dysfunction of thoracic region: Secondary | ICD-10-CM | POA: Diagnosis not present

## 2022-11-18 DIAGNOSIS — M9901 Segmental and somatic dysfunction of cervical region: Secondary | ICD-10-CM | POA: Diagnosis not present

## 2022-11-18 DIAGNOSIS — M5459 Other low back pain: Secondary | ICD-10-CM | POA: Diagnosis not present

## 2022-11-18 DIAGNOSIS — M546 Pain in thoracic spine: Secondary | ICD-10-CM | POA: Diagnosis not present

## 2022-11-18 DIAGNOSIS — M26623 Arthralgia of bilateral temporomandibular joint: Secondary | ICD-10-CM | POA: Diagnosis not present

## 2022-11-18 DIAGNOSIS — M9906 Segmental and somatic dysfunction of lower extremity: Secondary | ICD-10-CM | POA: Diagnosis not present

## 2022-11-18 DIAGNOSIS — M9903 Segmental and somatic dysfunction of lumbar region: Secondary | ICD-10-CM | POA: Diagnosis not present

## 2022-11-18 DIAGNOSIS — M542 Cervicalgia: Secondary | ICD-10-CM | POA: Diagnosis not present

## 2022-11-21 DIAGNOSIS — M9903 Segmental and somatic dysfunction of lumbar region: Secondary | ICD-10-CM | POA: Diagnosis not present

## 2022-11-21 DIAGNOSIS — M546 Pain in thoracic spine: Secondary | ICD-10-CM | POA: Diagnosis not present

## 2022-11-21 DIAGNOSIS — M26623 Arthralgia of bilateral temporomandibular joint: Secondary | ICD-10-CM | POA: Diagnosis not present

## 2022-11-21 DIAGNOSIS — M9902 Segmental and somatic dysfunction of thoracic region: Secondary | ICD-10-CM | POA: Diagnosis not present

## 2022-11-21 DIAGNOSIS — M9906 Segmental and somatic dysfunction of lower extremity: Secondary | ICD-10-CM | POA: Diagnosis not present

## 2022-11-21 DIAGNOSIS — M9901 Segmental and somatic dysfunction of cervical region: Secondary | ICD-10-CM | POA: Diagnosis not present

## 2022-11-21 DIAGNOSIS — M542 Cervicalgia: Secondary | ICD-10-CM | POA: Diagnosis not present

## 2022-11-21 DIAGNOSIS — M5459 Other low back pain: Secondary | ICD-10-CM | POA: Diagnosis not present

## 2022-11-22 ENCOUNTER — Other Ambulatory Visit: Payer: Self-pay

## 2022-11-22 DIAGNOSIS — M9906 Segmental and somatic dysfunction of lower extremity: Secondary | ICD-10-CM | POA: Diagnosis not present

## 2022-11-22 DIAGNOSIS — M9902 Segmental and somatic dysfunction of thoracic region: Secondary | ICD-10-CM | POA: Diagnosis not present

## 2022-11-22 DIAGNOSIS — M9903 Segmental and somatic dysfunction of lumbar region: Secondary | ICD-10-CM | POA: Diagnosis not present

## 2022-11-22 DIAGNOSIS — M9901 Segmental and somatic dysfunction of cervical region: Secondary | ICD-10-CM | POA: Diagnosis not present

## 2022-11-24 DIAGNOSIS — M9901 Segmental and somatic dysfunction of cervical region: Secondary | ICD-10-CM | POA: Diagnosis not present

## 2022-11-24 DIAGNOSIS — M9906 Segmental and somatic dysfunction of lower extremity: Secondary | ICD-10-CM | POA: Diagnosis not present

## 2022-11-24 DIAGNOSIS — M9903 Segmental and somatic dysfunction of lumbar region: Secondary | ICD-10-CM | POA: Diagnosis not present

## 2022-11-24 DIAGNOSIS — M9902 Segmental and somatic dysfunction of thoracic region: Secondary | ICD-10-CM | POA: Diagnosis not present

## 2022-11-25 DIAGNOSIS — M9903 Segmental and somatic dysfunction of lumbar region: Secondary | ICD-10-CM | POA: Diagnosis not present

## 2022-11-25 DIAGNOSIS — E282 Polycystic ovarian syndrome: Secondary | ICD-10-CM | POA: Diagnosis not present

## 2022-11-25 DIAGNOSIS — M9906 Segmental and somatic dysfunction of lower extremity: Secondary | ICD-10-CM | POA: Diagnosis not present

## 2022-11-25 DIAGNOSIS — M9901 Segmental and somatic dysfunction of cervical region: Secondary | ICD-10-CM | POA: Diagnosis not present

## 2022-11-25 DIAGNOSIS — M9902 Segmental and somatic dysfunction of thoracic region: Secondary | ICD-10-CM | POA: Diagnosis not present

## 2022-11-25 DIAGNOSIS — E559 Vitamin D deficiency, unspecified: Secondary | ICD-10-CM | POA: Diagnosis not present

## 2022-11-25 DIAGNOSIS — E063 Autoimmune thyroiditis: Secondary | ICD-10-CM | POA: Diagnosis not present

## 2022-11-28 DIAGNOSIS — M9901 Segmental and somatic dysfunction of cervical region: Secondary | ICD-10-CM | POA: Diagnosis not present

## 2022-11-28 DIAGNOSIS — M9903 Segmental and somatic dysfunction of lumbar region: Secondary | ICD-10-CM | POA: Diagnosis not present

## 2022-11-28 DIAGNOSIS — M9906 Segmental and somatic dysfunction of lower extremity: Secondary | ICD-10-CM | POA: Diagnosis not present

## 2022-11-28 DIAGNOSIS — M9902 Segmental and somatic dysfunction of thoracic region: Secondary | ICD-10-CM | POA: Diagnosis not present

## 2022-12-15 NOTE — Progress Notes (Signed)
[facility] [address] [place] [address] Phone: [phone] Subjective:    I'm seeing this patient by the request  of:  Patient, No Pcp Per  CC: Back and neck pain follow-up [id number]  Stacey Parker is a 52 y.o. female coming in with complaint of back and neck pain. OMT on 11/07/2022. Also seen for ankle pain. Patient states ankle is not doing the best, but better than it was. Can walk and lift weights, but no tennis.  Medications patient has been prescribed: Nitro patch  Taking: no         Reviewed prior external information including notes and imaging from previsou exam, outside providers and external EMR if available.   As well as notes that were available from care everywhere and other healthcare systems.  Past medical history, social, surgical and family history all reviewed in electronic medical record.  No pertanent information unless stated regarding to the chief complaint.   Past Medical History:  Diagnosis Date   Hashimoto's thyroiditis    Polycystic ovary     Allergies  Allergen Reactions   Sulfa Antibiotics Other (See Comments)     Review of Systems:  No headache, visual changes, nausea, vomiting, diarrhea, constipation, dizziness, abdominal pain, skin rash, fevers, chills, night sweats, weight loss, swollen lymph nodes, body aches, joint swelling, chest pain, shortness of breath, mood changes. POSITIVE muscle aches  Objective  Blood pressure 116/70, pulse 98, height 5\' 4"  (1.626 m), weight 152 lb (68.9 kg), SpO2 (!) 69 %.   General: No apparent distress alert and oriented x3 mood and affect normal, dressed appropriately.  HEENT: Pupils equal, extraocular movements intact  Respiratory: Patient's speak in full sentences and does not appear short of breath  Cardiovascular: No lower extremity edema, non tender, no erythema  Mild abnormal gait noted.  Still has tightness of the posterior cord noted.  Still  doing the exercises but still not great.  Achilles does have still a nodule noted but no erythema or swelling noted.  Osteopathic findings  C2 flexed rotated and side bent right C6 flexed rotated and side bent left T3 extended rotated and side bent right inhaled rib T9 extended rotated and side bent left L2 flexed rotated and side bent right Sacrum right on right  Limited muscular skeletal ultrasound was performed and interpreted by Antoine Primas, M  Limited ultrasound of patient's Achilles on the left side does have some tendinosis with some increasing diameter approximately 1-1/2 cm from the insertion.  Significant hypoechoic changes are increasing in neovascularization or Doppler flow.  Patient does still have a retrocalcaneal bursitis noted. Impression: Retrocalcaneal bursitis with tendinosis    Assessment and Plan:  Achilles tendinosis of left ankle Patient continues to do the exercises with not significant improvement.  I do think that instead we will put patient in a pneumatic heel brace that I think will be helpful.  Does have a retrocalcaneal bursitis and we did discuss the possibility of injection but we did discuss the possible concern for rupture.  Discussed with patient about icing regimen and home exercises, patient will come back again 6 to 8 weeks.    Nonallopathic problems  Decision today to treat with OMT was based on Physical Exam  After verbal consent patient was treated with HVLA, ME, FPR techniques in cervical, rib, thoracic, lumbar, and sacral  areas  Patient tolerated the procedure well with improvement in symptoms  Patient given exercises, stretches and lifestyle modifications  See  medications in patient instructions if given  Patient will follow up in 4-8 weeks     The above documentation has been reviewed and is accurate and complete Judi Saa, DO         Note: This dictation was prepared with Dragon dictation along with smaller phrase  technology. Any transcriptional errors that result from this process are unintentional.

## 2022-12-19 ENCOUNTER — Other Ambulatory Visit: Payer: Self-pay

## 2022-12-19 ENCOUNTER — Encounter: Payer: Self-pay | Admitting: Family Medicine

## 2022-12-19 ENCOUNTER — Ambulatory Visit: Payer: Commercial Managed Care - PPO | Admitting: Family Medicine

## 2022-12-19 VITALS — BP 116/70 | HR 98 | Ht 64.0 in | Wt 152.0 lb

## 2022-12-19 DIAGNOSIS — M9908 Segmental and somatic dysfunction of rib cage: Secondary | ICD-10-CM

## 2022-12-19 DIAGNOSIS — M9901 Segmental and somatic dysfunction of cervical region: Secondary | ICD-10-CM

## 2022-12-19 DIAGNOSIS — M9904 Segmental and somatic dysfunction of sacral region: Secondary | ICD-10-CM | POA: Diagnosis not present

## 2022-12-19 DIAGNOSIS — M9902 Segmental and somatic dysfunction of thoracic region: Secondary | ICD-10-CM

## 2022-12-19 DIAGNOSIS — M9903 Segmental and somatic dysfunction of lumbar region: Secondary | ICD-10-CM | POA: Diagnosis not present

## 2022-12-19 DIAGNOSIS — M67874 Other specified disorders of tendon, left ankle and foot: Secondary | ICD-10-CM | POA: Diagnosis not present

## 2022-12-19 DIAGNOSIS — M79672 Pain in left foot: Secondary | ICD-10-CM | POA: Diagnosis not present

## 2022-12-19 NOTE — Assessment & Plan Note (Signed)
Patient continues to do the exercises with not significant improvement.  I do think that instead we will put patient in a pneumatic heel brace that I think will be helpful.  Does have a retrocalcaneal bursitis and we did discuss the possibility of injection but we did discuss the possible concern for rupture.  Discussed with patient about icing regimen and home exercises, patient will come back again 6 to 8 weeks.

## 2022-12-19 NOTE — Patient Instructions (Signed)
You have 14 days to return or exchange your brace Call [phone], then return the brace to our office  Nitroglycerin Protocol   Apply 1/4 nitroglycerin patch to affected area daily.  Change position of patch within the affected area every 24 hours.  You may experience a headache during the first 1-2 weeks of using the patch, these should subside.  If you experience headaches after beginning nitroglycerin patch treatment, you may take your preferred over the counter pain reliever.  Another side effect of the nitroglycerin patch is skin irritation or rash related to patch adhesive.  Please notify our office if you develop more severe headaches or rash, and stop the patch.  Tendon healing with nitroglycerin patch may require 12 to 24 weeks depending on the extent of injury.  Men should not use if taking Viagra, Cialis, or Levitra.   Do not use if you have migraines or rosacea.  See you again in 6-8 weeks

## 2022-12-24 ENCOUNTER — Encounter: Payer: Self-pay | Admitting: Family Medicine

## 2022-12-28 ENCOUNTER — Encounter: Payer: Self-pay | Admitting: Family Medicine

## 2023-01-09 ENCOUNTER — Encounter: Payer: Self-pay | Admitting: Family Medicine

## 2023-01-13 ENCOUNTER — Other Ambulatory Visit: Payer: Self-pay

## 2023-01-25 NOTE — Progress Notes (Signed)
[facility] [address] [place] [address] Phone: [phone] Subjective:   Stacey Parker, am serving as a scribe for Dr. Antoine Primas.  I'm seeing this patient by the request  of:  Patient, No Pcp Per  CC: Back and neck pain follow-up  [id number]  Stacey Parker is a 52 y.o. female coming in with complaint of back and neck pain. OMT on 12/19/2022. Also seen for L achilles pain. Patient states that her foot pain has improved. Does still have pain with playing but the pain is tolerable and worse the following day. Doing exercises, ice and Voltaren. Unsure what braces to wear when playing. Did find Aircast airheel brace effective.   Does have some tightness in R hip and lower back.   Medications patient has been prescribed: npatch  Taking:         Reviewed prior external information including notes and imaging from previsou exam, outside providers and external EMR if available.   As well as notes that were available from care everywhere and other healthcare systems.  Past medical history, social, surgical and family history all reviewed in electronic medical record.  No pertanent information unless stated regarding to the chief complaint.   Past Medical History:  Diagnosis Date   Hashimoto's thyroiditis    Polycystic ovary     Allergies  Allergen Reactions   Sulfa Antibiotics Other (See Comments)     Review of Systems:  No headache, visual changes, nausea, vomiting, diarrhea, constipation, dizziness, abdominal pain, skin rash, fevers, chills, night sweats, weight loss, swollen lymph nodes, body aches, joint swelling, chest pain, shortness of breath, mood changes. POSITIVE muscle aches  Objective  Blood pressure 104/62, pulse 64, height 5\' 4"  (1.626 m), weight 154 lb (69.9 kg), SpO2 98 %.   General: No apparent distress alert and oriented x3 mood and affect normal, dressed appropriately.  HEENT: Pupils equal, extraocular  movements intact  Respiratory: Patient's speak in full sentences and does not appear short of breath  Cardiovascular: No lower extremity edema, non tender, no erythema  Patient's left ankle does still have a nodule noted of the Achilles.  No edema.  Minimally tender.  Good range of motion noted. Low back does have some loss lordosis noted as well.  Does have some very mild tightness noted more in the thoracolumbar juncture.  Osteopathic findings  C3 flexed rotated and side bent right C6 flexed rotated and side bent left T3 extended rotated and side bent right inhaled rib T11 extended rotated and side bent left L1 flexed rotated and side bent right Sacrum right on right   Limited muscular skeletal ultrasound was performed and interpreted by Antoine Primas, M  Limited ultrasound shows patient still has a nodule in the Achilles itself and no increase in hypoechoic changes.  No abnormal vascularity. Impression: Interval improvement    Assessment and Plan:  Low back pain Chronic but stable.  Discussed icing regimen and home exercises still.  Discussed the hip abductor strengthening that is necessary.  Increase activity slowly.  Follow-up again in 6 to 8 weeks.  Achilles tendinosis of left ankle Stable and making improvement.  No other changes in management.  Discussed the potential of changing to more of a compression sleeve at this time.    Nonallopathic problems  Decision today to treat with OMT was based on Physical Exam  After verbal consent patient was treated with HVLA, ME, FPR techniques in cervical, rib, thoracic, lumbar, and sacral  areas  Patient tolerated the procedure well with improvement in symptoms  Patient given exercises, stretches and lifestyle modifications  See medications in patient instructions if given  Patient will follow up in 4-8 weeks      The above documentation has been reviewed and is accurate and complete Stacey Saa, DO        Note:  This dictation was prepared with Dragon dictation along with smaller phrase technology. Any transcriptional errors that result from this process are unintentional.

## 2023-01-30 ENCOUNTER — Ambulatory Visit: Payer: Commercial Managed Care - PPO | Admitting: Family Medicine

## 2023-01-30 ENCOUNTER — Other Ambulatory Visit: Payer: Self-pay

## 2023-01-30 ENCOUNTER — Encounter: Payer: Self-pay | Admitting: Family Medicine

## 2023-01-30 VITALS — BP 104/62 | HR 64 | Ht 64.0 in | Wt 154.0 lb

## 2023-01-30 DIAGNOSIS — M9903 Segmental and somatic dysfunction of lumbar region: Secondary | ICD-10-CM

## 2023-01-30 DIAGNOSIS — M67874 Other specified disorders of tendon, left ankle and foot: Secondary | ICD-10-CM | POA: Diagnosis not present

## 2023-01-30 DIAGNOSIS — M79672 Pain in left foot: Secondary | ICD-10-CM | POA: Diagnosis not present

## 2023-01-30 DIAGNOSIS — M9902 Segmental and somatic dysfunction of thoracic region: Secondary | ICD-10-CM

## 2023-01-30 DIAGNOSIS — M9904 Segmental and somatic dysfunction of sacral region: Secondary | ICD-10-CM | POA: Diagnosis not present

## 2023-01-30 DIAGNOSIS — M9901 Segmental and somatic dysfunction of cervical region: Secondary | ICD-10-CM

## 2023-01-30 DIAGNOSIS — M9908 Segmental and somatic dysfunction of rib cage: Secondary | ICD-10-CM | POA: Diagnosis not present

## 2023-01-30 DIAGNOSIS — M545 Low back pain, unspecified: Secondary | ICD-10-CM | POA: Diagnosis not present

## 2023-01-30 DIAGNOSIS — G8929 Other chronic pain: Secondary | ICD-10-CM | POA: Diagnosis not present

## 2023-01-30 MED ORDER — GABAPENTIN 100 MG PO CAPS
200.0000 mg | ORAL_CAPSULE | Freq: Every day | ORAL | 0 refills | Status: DC
  Filled 2023-01-30: qty 180, 90d supply, fill #0

## 2023-01-30 NOTE — Assessment & Plan Note (Signed)
Chronic but stable.  Discussed icing regimen and home exercises still.  Discussed the hip abductor strengthening that is necessary.  Increase activity slowly.  Follow-up again in 6 to 8 weeks.

## 2023-01-30 NOTE — Patient Instructions (Signed)
See me again in 2 months °

## 2023-01-30 NOTE — Assessment & Plan Note (Signed)
Stable and making improvement.  No other changes in management.  Discussed the potential of changing to more of a compression sleeve at this time.

## 2023-02-07 ENCOUNTER — Other Ambulatory Visit: Payer: Self-pay

## 2023-02-07 MED ORDER — LEVOTHYROXINE SODIUM 50 MCG PO TABS
50.0000 ug | ORAL_TABLET | Freq: Every day | ORAL | 1 refills | Status: DC
  Filled 2023-02-07 – 2023-04-14 (×2): qty 90, 90d supply, fill #0
  Filled 2023-07-24: qty 90, 90d supply, fill #1

## 2023-02-07 MED ORDER — ESTRADIOL 0.05 MG/24HR TD PTTW
1.0000 | MEDICATED_PATCH | TRANSDERMAL | 1 refills | Status: AC
  Filled 2023-02-07: qty 24, 84d supply, fill #0
  Filled 2023-06-12: qty 24, 84d supply, fill #1

## 2023-02-10 ENCOUNTER — Other Ambulatory Visit: Payer: Self-pay

## 2023-02-26 ENCOUNTER — Telehealth: Payer: Commercial Managed Care - PPO | Admitting: Family Medicine

## 2023-02-26 DIAGNOSIS — U071 COVID-19: Secondary | ICD-10-CM | POA: Diagnosis not present

## 2023-02-26 DIAGNOSIS — J45909 Unspecified asthma, uncomplicated: Secondary | ICD-10-CM | POA: Diagnosis not present

## 2023-02-26 MED ORDER — NIRMATRELVIR/RITONAVIR (PAXLOVID)TABLET: 3.0000 | ORAL_TABLET | Freq: Two times a day (BID) | ORAL | 0 refills | Status: AC

## 2023-02-26 NOTE — Patient Instructions (Signed)

## 2023-02-26 NOTE — Progress Notes (Signed)
Virtual Visit Consent   Stacey Parker, you are scheduled for a virtual visit with a [facility] provider today. Just as with appointments in the office, your consent must be obtained to participate. Your consent will be active for this visit and any virtual visit you may have with one of our providers in the next 365 days. If you have a MyChart account, a copy of this consent can be sent to you electronically.  As this is a virtual visit, video technology does not allow for your provider to perform a traditional examination. This may limit your provider's ability to fully assess your condition. If your provider identifies any concerns that need to be evaluated in person or the need to arrange testing (such as labs, EKG, etc.), we will make arrangements to do so. Although advances in technology are sophisticated, we cannot ensure that it will always work on either your end or our end. If the connection with a video visit is poor, the visit may have to be switched to a telephone visit. With either a video or telephone visit, we are not always able to ensure that we have a secure connection.  By engaging in this virtual visit, you consent to the provision of healthcare and authorize for your insurance to be billed (if applicable) for the services provided during this visit. Depending on your insurance coverage, you may receive a charge related to this service.  I need to obtain your verbal consent now. Are you willing to proceed with your visit today? Stacey Parker has provided verbal consent on 02/26/2023 for a virtual visit (video or telephone). Georgana Curio, FNP  Date: 02/26/2023 11:17 AM  Virtual Visit via Video Note   I, Georgana Curio, connected with  Stacey Parker  ([id number], September 11, 1970) on 02/26/23 at 11:15 AM EDT by a video-enabled telemedicine application and verified that I am speaking with the correct person using two identifiers.  Location: Patient: Home Provider: Virtual  Visit Location Provider: Home Office   I discussed the limitations of evaluation and management by telemedicine and the availability of in person appointments. The patient expressed understanding and agreed to proceed.    History of Present Illness: Stacey Parker is a 52 y.o. who identifies as a female who was assigned female at birth, and is being seen today for covid positive testing today with sx starting yesterday of sore throat, dizziness and head congestion. No wheezing or SOB at this time. She does have exercise induced asthma. [name]  HPI: HPI  Problems:  Patient Active Problem List   Diagnosis Date Noted   Achilles tendinosis of left ankle 11/07/2022   Retrocalcaneal bursitis (back of heel), left 09/15/2022   Left elbow pain 10/20/2021   Cervical pain (neck) 08/05/2020   Low back pain 11/26/2019   Nonallopathic lesion of lumbosacral region 05/16/2019   Nonallopathic lesion of sacral region 05/16/2019   Nonallopathic lesion of thoracic region 05/16/2019   Lateral epicondylitis of right elbow 04/05/2019   Greater trochanteric bursitis of right hip 04/05/2019   Hashimoto's thyroiditis 05/10/2016   PCOS (polycystic ovarian syndrome) 03/27/2015   Irregular menses 03/27/2015    Allergies:  Allergies  Allergen Reactions   Sulfa Antibiotics Other (See Comments)   Medications:  Current Outpatient Medications:    albuterol (VENTOLIN HFA) 108 (90 Base) MCG/ACT inhaler, Inhale 1-2 puffs into the lungs every 6 (six) hours as needed for wheezing or shortness of breath. And before physical activity, Disp: 6.7 g, Rfl: 0  albuterol (VENTOLIN HFA) 108 (90 Base) MCG/ACT inhaler, Inhale 1-2 puffs into the lungs every 6 (six) hours as needed for wheezing or shortness of breath. And before physical activity, Disp: 6.7 g, Rfl: 2   budesonide-formoterol (SYMBICORT) 80-4.5 MCG/ACT inhaler, Inhale 2 puffs into the lungs 2 (two) times daily., Disp: 10.2 g, Rfl: 0   COVID-19 At Home Antigen Test  (CARESTART COVID-19 HOME TEST) KIT, Use as directed, Disp: 2 kit, Rfl: 0   Diclofenac Sodium (PENNSAID) 2 % SOLN, Place 2 g onto the skin 2 (two) times daily., Disp: 112 g, Rfl: 3   Diclofenac Sodium 2 % SOLN, Place 2 g onto the skin 2 (two) times daily., Disp: 112 g, Rfl: 3   estradiol (VIVELLE-DOT) 0.0375 MG/24HR, Place 1 patch onto the skin and change twice weekly, Disp: 8 patch, Rfl: 5   estradiol (VIVELLE-DOT) 0.0375 MG/24HR, Change patch twice weekly, Disp: 24 patch, Rfl: 1   estradiol (VIVELLE-DOT) 0.05 MG/24HR patch, APPLY 1 PATCH AND CHANGE TWICE WEEKLY, Disp: 8 patch, Rfl: 5   estradiol (VIVELLE-DOT) 0.05 MG/24HR patch, APPLY PATCH TWICE WEEKLY, Disp: 8 patch, Rfl: 3   estradiol (VIVELLE-DOT) 0.05 MG/24HR patch, Change patch twice weekly, Disp: 24 patch, Rfl: 1   estradiol (VIVELLE-DOT) 0.05 MG/24HR patch, Place 1 patch (0.05 mg total) onto the skin 2 (two) times a week., Disp: 24 patch, Rfl: 1   estradiol (VIVELLE-DOT) 0.05 MG/24HR patch, Place 1 patch (0.05 mg total) onto the skin 2 (two) times a week., Disp: 24 patch, Rfl: 1   fluconazole (DIFLUCAN) 150 MG tablet, Take 1 tablet (150 mg total) by mouth once a week., Disp: 4 tablet, Rfl: 0   gabapentin (NEURONTIN) 100 MG capsule, Take 2 capsules (200 mg total) by mouth at bedtime., Disp: 180 capsule, Rfl: 0   influenza vac split quadrivalent PF (FLUARIX QUADRIVALENT) 0.5 ML injection, Inject into the muscle., Disp: 0.5 mL, Rfl: 0   levothyroxine (SYNTHROID) 50 MCG tablet, Take 1 tablet (50 mcg total) by mouth daily on an empty stomach., Disp: 90 tablet, Rfl: 1   meloxicam (MOBIC) 7.5 MG tablet, Take 1 tablet (7.5 mg total) by mouth daily., Disp: 30 tablet, Rfl: 0   miconazole (MICATIN) 2 % cream, Apply 1 application topically 2 (two) times daily., Disp: 28.35 g, Rfl: 1   nitroGLYCERIN (NITRO-DUR) 0.2 mg/hr patch, Apply 1/4 of a patch to skin once daily., Disp: 30 patch, Rfl: 0   oseltamivir (TAMIFLU) 75 MG capsule, Take 1 capsule (75 mg  total) by mouth 2 (two) times daily., Disp: 10 capsule, Rfl: 0   progesterone (PROMETRIUM) 100 MG capsule, Take 1 capsule (100 mg total) by mouth at bedtime, Disp: 90 capsule, Rfl: 3   Progesterone Micronized (PROGESTERONE PO), Take by mouth. Take on days 12-26 through of cycle, Disp: , Rfl:    SYNTHROID 50 MCG tablet, , Disp: , Rfl: 1   SYNTHROID 50 MCG tablet, TAKE 1 TABLET BY MOUTH ONCE DAILY ON AN EMPTY STOMACH, Disp: 90 tablet, Rfl: 1  Observations/Objective: Patient is well-developed, well-nourished in no acute distress.  Resting comfortably  at home.  Head is normocephalic, atraumatic.  No labored breathing.  Speech is clear and coherent with logical content.  Patient is alert and oriented at baseline.    Assessment and Plan: 1. COVID-19  2. Uncomplicated asthma, unspecified asthma severity, unspecified whether persistent  Increase fluids, she has an albuterol inhaler and prednisone at home if she needs it. Increase fluids, quarantine discussed, add vit d and zinc.  UC if sx worsen.   Follow Up Instructions: I discussed the assessment and treatment plan with the patient. The patient was provided an opportunity to ask questions and all were answered. The patient agreed with the plan and demonstrated an understanding of the instructions.  A copy of instructions were sent to the patient via MyChart unless otherwise noted below.     The patient was advised to call back or seek an in-person evaluation if the symptoms worsen or if the condition fails to improve as anticipated.  Time:  I spent 10 minutes with the patient via telehealth technology discussing the above problems/concerns.    Georgana Curio, FNP

## 2023-03-22 DIAGNOSIS — E282 Polycystic ovarian syndrome: Secondary | ICD-10-CM | POA: Diagnosis not present

## 2023-03-22 DIAGNOSIS — E063 Autoimmune thyroiditis: Secondary | ICD-10-CM | POA: Diagnosis not present

## 2023-03-22 DIAGNOSIS — E559 Vitamin D deficiency, unspecified: Secondary | ICD-10-CM | POA: Diagnosis not present

## 2023-03-30 NOTE — Progress Notes (Unsigned)
[facility] [address] [place] [address] Phone: [phone] Subjective:   Stacey Parker, am serving as a scribe for Dr. Antoine Primas.  I'm seeing this patient by the request  of:  Patient, No Pcp Per  CC: Neck and back pain follow-up  [id number]  Stacey Parker is a 52 y.o. female coming in with complaint of back and neck. OMT on 01/30/2023.   Patient states that her ankle is still not 100%. Did a workout this morning and her seems to be coming back in L achilles tendon. Has been doing eccentric calf raises and ankle stabilization exercises.   Medications patient has been prescribed: gabapentin  Taking: Yes         Reviewed prior external information including notes and imaging from previsou exam, outside providers and external EMR if available.   As well as notes that were available from care everywhere and other healthcare systems.  Past medical history, social, surgical and family history all reviewed in electronic medical record.  No pertanent information unless stated regarding to the chief complaint.   Past Medical History:  Diagnosis Date   Hashimoto's thyroiditis    Polycystic ovary     Allergies  Allergen Reactions   Sulfa Antibiotics Other (See Comments)     Review of Systems:  No headache, visual changes, nausea, vomiting, diarrhea, constipation, dizziness, abdominal pain, skin rash, fevers, chills, night sweats, weight loss, swollen lymph nodes, body aches, joint swelling, chest pain, shortness of breath, mood changes. POSITIVE muscle aches  Objective  Blood pressure 102/74, pulse (!) 58, height 5\' 4"  (1.626 m), weight 155 lb (70.3 kg), SpO2 98%.   General: No apparent distress alert and oriented x3 mood and affect normal, dressed appropriately.  HEENT: Pupils equal, extraocular movements intact  Respiratory: Patient's speak in full sentences and does not appear short of breath  Cardiovascular: No lower  extremity edema, non tender, no erythema  Neck exam does have some loss of lordosis noted.  Some tenderness to palpation of the paraspinal musculature.  Seems to be more in the right scapular area. Acutely still did have some tenderness noted on the left side.  Nodule noted about 2 cm proximal to the insertion.  Limited muscular skeletal ultrasound was performed and interpreted by Antoine Primas, M   Limited ultrasound showed the patient did have dilatation or enlargement of the Achilles noted with no significant acute abnormalities noted. Impression: Tendinosis of the Achilles stable  Osteopathic findings  C3 flexed rotated and side bent right C7 flexed rotated and side bent left T3 extended rotated and side bent right inhaled rib T9 extended rotated and side bent left L2 flexed rotated and side bent right Sacrum right on right       Assessment and Plan:  Cervical pain (neck) Discussed cervical pain and icing regimen.  Discussed which activities to do and which ones to avoid.  Increase activity slowly.  Responding well to osteopathic manipulation.  Follow-up again in 2 months  Achilles tendinosis of left ankle Continue to monitor at this time.  No significant other changes at the moment.  Some mild nodule but no significant inflammation noted on ultrasound today.    Nonallopathic problems  Decision today to treat with OMT was based on Physical Exam  After verbal consent patient was treated with HVLA, ME, FPR techniques in cervical, rib, thoracic, lumbar, and sacral  areas  Patient tolerated the procedure well with improvement in symptoms  Patient given  exercises, stretches and lifestyle modifications  See medications in patient instructions if given  Patient will follow up in 4-8 weeks     The above documentation has been reviewed and is accurate and complete Judi Saa, DO         Note: This dictation was prepared with Dragon dictation along with smaller  phrase technology. Any transcriptional errors that result from this process are unintentional.

## 2023-04-07 ENCOUNTER — Other Ambulatory Visit: Payer: Self-pay

## 2023-04-07 ENCOUNTER — Ambulatory Visit: Payer: Commercial Managed Care - PPO | Admitting: Family Medicine

## 2023-04-07 VITALS — BP 102/74 | HR 58 | Ht 64.0 in | Wt 155.0 lb

## 2023-04-07 DIAGNOSIS — M542 Cervicalgia: Secondary | ICD-10-CM

## 2023-04-07 DIAGNOSIS — M9902 Segmental and somatic dysfunction of thoracic region: Secondary | ICD-10-CM | POA: Diagnosis not present

## 2023-04-07 DIAGNOSIS — M67874 Other specified disorders of tendon, left ankle and foot: Secondary | ICD-10-CM

## 2023-04-07 DIAGNOSIS — M9901 Segmental and somatic dysfunction of cervical region: Secondary | ICD-10-CM

## 2023-04-07 DIAGNOSIS — M9904 Segmental and somatic dysfunction of sacral region: Secondary | ICD-10-CM

## 2023-04-07 DIAGNOSIS — M9908 Segmental and somatic dysfunction of rib cage: Secondary | ICD-10-CM

## 2023-04-07 DIAGNOSIS — M25572 Pain in left ankle and joints of left foot: Secondary | ICD-10-CM | POA: Diagnosis not present

## 2023-04-07 DIAGNOSIS — G8929 Other chronic pain: Secondary | ICD-10-CM

## 2023-04-07 DIAGNOSIS — M9903 Segmental and somatic dysfunction of lumbar region: Secondary | ICD-10-CM

## 2023-04-07 NOTE — Patient Instructions (Signed)
Every cup of water get cup of Gatorade zero or something  Keep me updated if anything changes  Achilles looks ok  DHEA 50 mg daily for 4 weeks See me again in 6 weeks

## 2023-04-07 NOTE — Assessment & Plan Note (Signed)
Continue to monitor at this time.  No significant other changes at the moment.  Some mild nodule but no significant inflammation noted on ultrasound today.

## 2023-04-07 NOTE — Assessment & Plan Note (Signed)
Discussed cervical pain and icing regimen.  Discussed which activities to do and which ones to avoid.  Increase activity slowly.  Responding well to osteopathic manipulation.  Follow-up again in 2 months

## 2023-04-09 ENCOUNTER — Encounter: Payer: Self-pay | Admitting: Family Medicine

## 2023-04-14 ENCOUNTER — Other Ambulatory Visit: Payer: Self-pay

## 2023-04-18 DIAGNOSIS — R682 Dry mouth, unspecified: Secondary | ICD-10-CM | POA: Diagnosis not present

## 2023-04-18 DIAGNOSIS — J45990 Exercise induced bronchospasm: Secondary | ICD-10-CM | POA: Diagnosis not present

## 2023-04-18 DIAGNOSIS — E039 Hypothyroidism, unspecified: Secondary | ICD-10-CM | POA: Diagnosis not present

## 2023-04-18 DIAGNOSIS — R631 Polydipsia: Secondary | ICD-10-CM | POA: Diagnosis not present

## 2023-04-18 DIAGNOSIS — Z1159 Encounter for screening for other viral diseases: Secondary | ICD-10-CM | POA: Diagnosis not present

## 2023-04-18 DIAGNOSIS — Z1231 Encounter for screening mammogram for malignant neoplasm of breast: Secondary | ICD-10-CM | POA: Diagnosis not present

## 2023-04-18 DIAGNOSIS — Z1211 Encounter for screening for malignant neoplasm of colon: Secondary | ICD-10-CM | POA: Diagnosis not present

## 2023-04-18 DIAGNOSIS — Z0000 Encounter for general adult medical examination without abnormal findings: Secondary | ICD-10-CM | POA: Diagnosis not present

## 2023-04-19 ENCOUNTER — Other Ambulatory Visit: Payer: Self-pay | Admitting: Internal Medicine

## 2023-04-19 DIAGNOSIS — Z1231 Encounter for screening mammogram for malignant neoplasm of breast: Secondary | ICD-10-CM

## 2023-04-20 DIAGNOSIS — Z1159 Encounter for screening for other viral diseases: Secondary | ICD-10-CM | POA: Diagnosis not present

## 2023-04-20 DIAGNOSIS — R682 Dry mouth, unspecified: Secondary | ICD-10-CM | POA: Diagnosis not present

## 2023-04-20 DIAGNOSIS — E039 Hypothyroidism, unspecified: Secondary | ICD-10-CM | POA: Diagnosis not present

## 2023-04-20 DIAGNOSIS — R631 Polydipsia: Secondary | ICD-10-CM | POA: Diagnosis not present

## 2023-04-20 DIAGNOSIS — Z0000 Encounter for general adult medical examination without abnormal findings: Secondary | ICD-10-CM | POA: Diagnosis not present

## 2023-04-25 ENCOUNTER — Other Ambulatory Visit: Payer: Self-pay

## 2023-04-25 MED ORDER — ESTRADIOL 0.05 MG/24HR TD PTTW
1.0000 | MEDICATED_PATCH | TRANSDERMAL | 1 refills | Status: AC
  Filled 2023-04-25 – 2024-02-18 (×2): qty 24, 84d supply, fill #0

## 2023-05-05 ENCOUNTER — Other Ambulatory Visit: Payer: Self-pay

## 2023-05-19 ENCOUNTER — Ambulatory Visit: Payer: Commercial Managed Care - PPO | Admitting: Family Medicine

## 2023-06-01 NOTE — Progress Notes (Signed)
[name] [address] [place] [address] Phone: [phone] Subjective:   Stacey Parker, am serving as a scribe for Dr. Antoine Primas.  I'm seeing this patient by the request  of:  Patient, No Pcp Per  CC: back and neck pain follow up ankle follow up   [id number]  ESME Parker is a 52 y.o. female coming in with complaint of back and neck pain. OMT on 04/07/2023. Patient states that she has been doing well since last visit.   Ankle pain has improved. C/o B elbow pain which is not as bad as it was initially. Pain over lateral epicondyle and olecranon. Wearing compression sleeves with weights. Believes pain is coming from weight training vs tennis. Has been increasing weight recently.            Reviewed prior external information including notes and imaging from previsou exam, outside providers and external EMR if available.   As well as notes that were available from care everywhere and other healthcare systems.  Past medical history, social, surgical and family history all reviewed in electronic medical record.  No pertanent information unless stated regarding to the chief complaint.   Past Medical History:  Diagnosis Date   Hashimoto's thyroiditis    Polycystic ovary     Allergies  Allergen Reactions   Sulfa Antibiotics Other (See Comments)     Review of Systems:  No headache, visual changes, nausea, vomiting, diarrhea, constipation, dizziness, abdominal pain, skin rash, fevers, chills, night sweats, weight loss, swollen lymph nodes, body aches, joint swelling, chest pain, shortness of breath, mood changes. POSITIVE muscle aches  Objective  Blood pressure 100/74, pulse (!) 59, height 5\' 4"  (1.626 m), weight 153 lb (69.4 kg), SpO2 98%.   General: No apparent distress alert and oriented x3 mood and affect normal, dressed appropriately.  HEENT: Pupils equal, extraocular movements intact  Respiratory: Patient's speak in  full sentences and does not appear short of breath  Cardiovascular: No lower extremity edema, non tender, no erythema  MSK:  Back  Patient does have tenderness to palpation over the lateral epicondylar area and the mid substance of the common extensor muscle. Patient's neck exam does have some limited extension noted.  No significant impingement noted.  Osteopathic findings  C2 flexed rotated and side bent right C6 flexed rotated and side bent left C7 flexed rotated and side bent right T3 extended rotated and side bent right inhaled rib L2 flexed rotated and side bent right Sacrum right on right     Assessment and Plan:  Left elbow pain Continues to have bilateral elbow pain.  Likely secondary to some different posture and ergonomics.  Discussed which activities to do and which ones to avoid.  Increase activity slowly over the course of next several weeks. RTC in 6-8 weeks     Nonallopathic problems  Decision today to treat with OMT was based on Physical Exam  After verbal consent patient was treated with HVLA, ME, FPR techniques in cervical, rib, thoracic, lumbar, and sacral  areas  Patient tolerated the procedure well with improvement in symptoms  Patient given exercises, stretches and lifestyle modifications  See medications in patient instructions if given  Patient will follow up in 4-8 weeks     The above documentation has been reviewed and is accurate and complete Judi Saa, DO         Note: This dictation was prepared with Dragon dictation along with smaller phrase technology.  Any transcriptional errors that result from this process are unintentional.

## 2023-06-06 ENCOUNTER — Encounter: Payer: Self-pay | Admitting: Family Medicine

## 2023-06-06 ENCOUNTER — Ambulatory Visit: Payer: Commercial Managed Care - PPO | Admitting: Family Medicine

## 2023-06-06 VITALS — BP 100/74 | HR 59 | Ht 64.0 in | Wt 153.0 lb

## 2023-06-06 DIAGNOSIS — M9908 Segmental and somatic dysfunction of rib cage: Secondary | ICD-10-CM

## 2023-06-06 DIAGNOSIS — M9901 Segmental and somatic dysfunction of cervical region: Secondary | ICD-10-CM | POA: Diagnosis not present

## 2023-06-06 DIAGNOSIS — M542 Cervicalgia: Secondary | ICD-10-CM | POA: Diagnosis not present

## 2023-06-06 DIAGNOSIS — M25522 Pain in left elbow: Secondary | ICD-10-CM | POA: Diagnosis not present

## 2023-06-06 DIAGNOSIS — M9902 Segmental and somatic dysfunction of thoracic region: Secondary | ICD-10-CM | POA: Diagnosis not present

## 2023-06-06 DIAGNOSIS — M9903 Segmental and somatic dysfunction of lumbar region: Secondary | ICD-10-CM | POA: Diagnosis not present

## 2023-06-06 DIAGNOSIS — M9904 Segmental and somatic dysfunction of sacral region: Secondary | ICD-10-CM

## 2023-06-06 NOTE — Patient Instructions (Signed)
Good to see you! Try a weighted vest instead of increasing weight Get graston tool See you again in 6-8 weeks

## 2023-06-06 NOTE — Assessment & Plan Note (Signed)
Continues to have bilateral elbow pain.  Likely secondary to some different posture and ergonomics.  Discussed which activities to do and which ones to avoid.  Increase activity slowly over the course of next several weeks. RTC in 6-8 weeks

## 2023-06-06 NOTE — Assessment & Plan Note (Signed)
Tightness in the neck noted as well.  Discussed which activities to do and which ones to avoid still.  Will work on posture and ergonomics, increase activity slowly.  Follow-up again in 6 to 8 weeks

## 2023-06-12 ENCOUNTER — Other Ambulatory Visit: Payer: Self-pay | Admitting: Family Medicine

## 2023-06-12 ENCOUNTER — Other Ambulatory Visit: Payer: Self-pay

## 2023-06-12 MED ORDER — GABAPENTIN 100 MG PO CAPS
200.0000 mg | ORAL_CAPSULE | Freq: Every day | ORAL | 0 refills | Status: DC
  Filled 2023-06-12: qty 180, 90d supply, fill #0

## 2023-07-19 NOTE — Progress Notes (Unsigned)
  [name] [address] [place] [address] Phone: [phone] Subjective:   [name], am serving as a scribe for Dr. Antoine Primas.  I'm seeing this patient by the request  of:  Patient, No Pcp Per  CC: Back and neck pain  Stacey Parker  Stacey Parker is a 52 y.o. female coming in with complaint of back and neck pain. OMT on 06/06/2023. Patient states same per usual. No changes.  Has had some more stress recently but nothing too severe.  Does feel that the elbow pain is definitely secondary to more nerve.         Reviewed prior external information including notes and imaging from previsou exam, outside providers and external EMR if available.   As well as notes that were available from care everywhere and other healthcare systems.  Past medical history, social, surgical and family history all reviewed in electronic medical record.  No pertanent information unless stated regarding to the chief complaint.   Past Medical History:  Diagnosis Date   Hashimoto's thyroiditis    Polycystic ovary     Allergies  Allergen Reactions   Sulfa Antibiotics Other (See Comments)     Review of Systems:  No headache, visual changes, nausea, vomiting, diarrhea, constipation, dizziness, abdominal pain, skin rash, fevers, chills, night sweats, weight loss, swollen lymph nodes, body aches, joint swelling, chest pain, shortness of breath, mood changes. POSITIVE muscle aches  Objective  Blood pressure 108/68, pulse 75, height 5\' 4"  (1.626 m), weight 153 lb (69.4 kg), SpO2 97%.   General: No apparent distress alert and oriented x3 mood and affect normal, dressed appropriately.  HEENT: Pupils equal, extraocular movements intact  Respiratory: Patient's speak in full sentences and does not appear short of breath  Cardiovascular: No lower extremity edema, non tender, no erythema  Neck exam does have significant tenderness noted.  Left scapular area  and did have significant discomfort and pain noted and we will monitor.  Increase activity slowly over the course of next several weeks.  Osteopathic findings  C3 flexed rotated and side bent right C4 flexed rotated and side bent left C6 flexed rotated and side bent left T3 extended rotated and side bent right inhaled rib T6 extended rotated and side bent left L2 flexed rotated and side bent right Sacrum right on right     Assessment and Plan:  Cervical pain (neck) ?  Radicular symptoms.  Likely more secondary to more of an ulnar nerve neuropathy.  Will start gabapentin at a higher dose of 300 mg.  We discussed icing regimen RTC in 6 weeks     Nonallopathic problems  Decision today to treat with OMT was based on Physical Exam  After verbal consent patient was treated with HVLA, ME, FPR techniques in cervical, rib, thoracic, lumbar, and sacral  areas  Patient tolerated the procedure well with improvement in symptoms  Patient given exercises, stretches and lifestyle modifications  See medications in patient instructions if given  Patient will follow up in 4-8 weeks     The above documentation has been reviewed and is accurate and complete Judi Saa, DO         Note: This dictation was prepared with Dragon dictation along with smaller phrase technology. Any transcriptional errors that result from this process are unintentional.

## 2023-07-20 ENCOUNTER — Other Ambulatory Visit: Payer: Self-pay

## 2023-07-20 ENCOUNTER — Encounter: Payer: Self-pay | Admitting: Pharmacist

## 2023-07-20 ENCOUNTER — Encounter: Payer: Self-pay | Admitting: Family Medicine

## 2023-07-20 ENCOUNTER — Ambulatory Visit: Payer: Commercial Managed Care - PPO | Admitting: Family Medicine

## 2023-07-20 VITALS — BP 108/68 | HR 75 | Ht 64.0 in | Wt 153.0 lb

## 2023-07-20 DIAGNOSIS — M9908 Segmental and somatic dysfunction of rib cage: Secondary | ICD-10-CM

## 2023-07-20 DIAGNOSIS — M542 Cervicalgia: Secondary | ICD-10-CM | POA: Diagnosis not present

## 2023-07-20 DIAGNOSIS — M9903 Segmental and somatic dysfunction of lumbar region: Secondary | ICD-10-CM | POA: Diagnosis not present

## 2023-07-20 DIAGNOSIS — M9904 Segmental and somatic dysfunction of sacral region: Secondary | ICD-10-CM

## 2023-07-20 DIAGNOSIS — M9902 Segmental and somatic dysfunction of thoracic region: Secondary | ICD-10-CM

## 2023-07-20 DIAGNOSIS — M9901 Segmental and somatic dysfunction of cervical region: Secondary | ICD-10-CM

## 2023-07-20 MED ORDER — GABAPENTIN 300 MG PO CAPS
300.0000 mg | ORAL_CAPSULE | Freq: Every day | ORAL | 0 refills | Status: DC
  Filled 2023-07-20 – 2023-08-28 (×2): qty 90, 90d supply, fill #0

## 2023-07-20 NOTE — Patient Instructions (Signed)
Gabapentin 300mg  See you again in 6 weeks

## 2023-07-20 NOTE — Assessment & Plan Note (Signed)
?    Radicular symptoms.  Likely more secondary to more of an ulnar nerve neuropathy.  Will start gabapentin at a higher dose of 300 mg.  We discussed icing regimen RTC in 6 weeks

## 2023-07-24 ENCOUNTER — Other Ambulatory Visit: Payer: Self-pay

## 2023-08-11 DIAGNOSIS — E282 Polycystic ovarian syndrome: Secondary | ICD-10-CM | POA: Diagnosis not present

## 2023-08-11 DIAGNOSIS — E063 Autoimmune thyroiditis: Secondary | ICD-10-CM | POA: Diagnosis not present

## 2023-08-11 DIAGNOSIS — N951 Menopausal and female climacteric states: Secondary | ICD-10-CM | POA: Diagnosis not present

## 2023-08-15 ENCOUNTER — Other Ambulatory Visit: Payer: Self-pay

## 2023-08-15 MED ORDER — ESTRADIOL 0.05 MG/24HR TD PTTW
1.0000 | MEDICATED_PATCH | TRANSDERMAL | 1 refills | Status: AC
  Filled 2023-08-15: qty 24, 84d supply, fill #0
  Filled 2023-11-20: qty 24, 84d supply, fill #1

## 2023-08-15 MED ORDER — LEVOTHYROXINE SODIUM 50 MCG PO TABS
50.0000 ug | ORAL_TABLET | Freq: Every day | ORAL | 1 refills | Status: DC
  Filled 2023-08-15 – 2023-12-04 (×2): qty 90, 90d supply, fill #0
  Filled 2024-05-01: qty 90, 90d supply, fill #1

## 2023-08-28 ENCOUNTER — Other Ambulatory Visit: Payer: Self-pay

## 2023-08-29 ENCOUNTER — Other Ambulatory Visit: Payer: Self-pay

## 2023-08-30 ENCOUNTER — Telehealth: Payer: Commercial Managed Care - PPO | Admitting: Nurse Practitioner

## 2023-08-30 ENCOUNTER — Other Ambulatory Visit: Payer: Self-pay

## 2023-08-30 DIAGNOSIS — B9789 Other viral agents as the cause of diseases classified elsewhere: Secondary | ICD-10-CM | POA: Diagnosis not present

## 2023-08-30 DIAGNOSIS — J988 Other specified respiratory disorders: Secondary | ICD-10-CM

## 2023-08-30 MED ORDER — PSEUDOEPH-BROMPHEN-DM 30-2-10 MG/5ML PO SYRP
5.0000 mL | ORAL_SOLUTION | Freq: Four times a day (QID) | ORAL | 0 refills | Status: AC | PRN
  Filled 2023-08-30: qty 118, 6d supply, fill #0

## 2023-08-30 MED ORDER — FLUTICASONE PROPIONATE 50 MCG/ACT NA SUSP
2.0000 | Freq: Every day | NASAL | 0 refills | Status: AC
  Filled 2023-08-30: qty 16, 30d supply, fill #0

## 2023-08-30 NOTE — Progress Notes (Signed)
E-Visit for Upper Respiratory Infection   We are sorry you are not feeling well.  Here is how we plan to help!  Based on what you have shared with me, it looks like you may have a viral upper respiratory infection.  Upper respiratory infections are caused by a large number of viruses; however, rhinovirus is the most common cause.   Symptoms vary from person to person, with common symptoms including sore throat, cough, fatigue or lack of energy and feeling of general discomfort.  A low-grade fever of up to 100.4 may present, but is often uncommon.  Symptoms vary however, and are closely related to a person's age or underlying illnesses.  The most common symptoms associated with an upper respiratory infection are nasal discharge or congestion, cough, sneezing, headache and pressure in the ears and face.  These symptoms usually persist for about 3 to 10 days, but can last up to 2 weeks.  It is important to know that upper respiratory infections do not cause serious illness or complications in most cases.    Upper respiratory infections can be transmitted from person to person, with the most common method of transmission being a person's hands.  The virus is able to live on the skin and can infect other persons for up to 2 hours after direct contact.  Also, these can be transmitted when someone coughs or sneezes; thus, it is important to cover the mouth to reduce this risk.  To keep the spread of the illness at bay, good hand hygiene is very important.  This is an infection that is most likely caused by a virus. There are no specific treatments other than to help you with the symptoms until the infection runs its course.  We are sorry you are not feeling well.  Here is how we plan to help!   For nasal congestion, you may use an oral decongestants such as Mucinex D or if you have glaucoma or high blood pressure use plain Mucinex.  Saline nasal spray or nasal drops can help and can safely be used as often as  needed for congestion.  For your congestion, I have prescribed Fluticasone nasal spray one spray in each nostril twice a day  If you do not have a history of heart disease, hypertension, diabetes or thyroid disease, prostate/bladder issues or glaucoma, you may also use Sudafed to treat nasal congestion.  It is highly recommended that you consult with a pharmacist or your primary care physician to ensure this medication is safe for you to take.     For cough I have prescribed for you Bromfed DM Take 5mL every 6 hours as needed for cough and congestion.  If you have a sore or scratchy throat, use a saltwater gargle-  to  teaspoon of salt dissolved in a 4-ounce to 8-ounce glass of warm water.  Gargle the solution for approximately 15-30 seconds and then spit.  It is important not to swallow the solution.  You can also use throat lozenges/cough drops and Chloraseptic spray to help with throat pain or discomfort.  Warm or cold liquids can also be helpful in relieving throat pain.  For headache, pain or general discomfort, you can use Ibuprofen or Tylenol as directed.   Some authorities believe that zinc sprays or the use of Echinacea may shorten the course of your symptoms.   HOME CARE Only take medications as instructed by your medical team. Be sure to drink plenty of fluids. Water is fine as well  as fruit juices, sodas and electrolyte beverages. You may want to stay away from caffeine or alcohol. If you are nauseated, try taking small sips of liquids. How do you know if you are getting enough fluid? Your urine should be a pale yellow or almost colorless. Get rest. Taking a steamy shower or using a humidifier may help nasal congestion and ease sore throat pain. You can place a towel over your head and breathe in the steam from hot water coming from a faucet. Using a saline nasal spray works much the same way. Cough drops, hard candies and sore throat lozenges may ease your cough. Avoid close  contacts especially the very young and the elderly Cover your mouth if you cough or sneeze Always remember to wash your hands.   GET HELP RIGHT AWAY IF: You develop worsening fever. If your symptoms do not improve within 10 days You develop yellow or green discharge from your nose over 3 days. You have coughing fits You develop a severe head ache or visual changes. You develop shortness of breath, difficulty breathing or start having chest pain Your symptoms persist after you have completed your treatment plan  MAKE SURE YOU  Understand these instructions. Will watch your condition. Will get help right away if you are not doing well or get worse.  Thank you for choosing an e-visit.  Your e-visit answers were reviewed by a board certified advanced clinical practitioner to complete your personal care plan. Depending upon the condition, your plan could have included both over the counter or prescription medications.  Please review your pharmacy choice. Make sure the pharmacy is open so you can pick up prescription now. If there is a problem, you may contact your provider through [employer] and have the prescription routed to another pharmacy.  Your safety is important to [place]. If you have drug allergies check your prescription carefully.   For the next 24 hours you can use MyChart to ask questions about today's visit, request a non-urgent call back, or ask for a work or school excuse. You will get an email in the next two days asking about your experience. I hope that your e-visit has been valuable and will speed your recovery.    I have spent 5 minutes in review of e-visit questionnaire, review and updating patient chart, medical decision making and response to patient.   Margaretann Loveless, PA-C

## 2023-08-30 NOTE — Progress Notes (Unsigned)
[name] [address] [place] [address] Phone: [phone] Subjective:   Stacey Parker, am serving as a scribe for Dr. Antoine Primas. I'm seeing this patient by the request  of:  Patient, No Pcp Per  CC: Back and neck pain follow-up  [id number]  Stacey Parker is a 53 y.o. female coming in with complaint of back and neck pain. OMT on 07/20/2023. Patient states that she is tight due to not feeling well with URI.   Starting to feel pain coming back in ankle and elbow when playing tennis. Forearm is always tight and she has been working on fascia  Medications patient has been prescribed: Gabapentin  Taking:         Reviewed prior external information including notes and imaging from previsou exam, outside providers and external EMR if available.   As well as notes that were available from care everywhere and other healthcare systems.  Past medical history, social, surgical and family history all reviewed in electronic medical record.  No pertanent information unless stated regarding to the chief complaint.   Past Medical History:  Diagnosis Date   Hashimoto's thyroiditis    Polycystic ovary     Allergies  Allergen Reactions   Sulfa Antibiotics Other (See Comments)     Review of Systems:  No headache, visual changes, nausea, vomiting, diarrhea, constipation, dizziness, abdominal pain, skin rash, fevers, chills, night sweats, weight loss, swollen lymph nodes, body aches, joint swelling, chest pain, shortness of breath, mood changes. POSITIVE muscle aches  Objective  Blood pressure 102/62, pulse 67, height 5\' 4"  (1.626 m), weight 159 lb (72.1 kg), SpO2 97%.   General: No apparent distress alert and oriented x3 mood and affect normal, dressed appropriately.  HEENT: Pupils equal, extraocular movements intact  Respiratory: Patient's speak in full sentences and does not appear short of breath  Cardiovascular: No lower  extremity edema, non tender, no erythema  MSK:  Back does have some tightness noted, neck exam does patient does have some limited sidebending bilaterally.  Osteopathic findings  C2 flexed rotated and side bent right C7 flexed rotated and side bent left T5 extended rotated and side bent right inhaled rib T8 extended rotated and side bent left L3 flexed rotated and side bent right Sacrum right on right     Assessment and Plan:  Achilles tendinosis of left ankle Worsening symptoms with patient not being quite as active.  Would like to see how patient responds to this and has had all activities including formal physical therapy, bracing, icing regimen, heel lifts.  Will see how shockwave therapy works for this individual.  Follow-up with me again in 6 to 8 weeks otherwise.  Cervical pain (neck) Discussed HEP  Discussed which activities to avoid.  Keep working on posture    Lateral epicondylitis of right elbow Will try shockwave as well.  ?PRP    Nonallopathic problems  Decision today to treat with OMT was based on Physical Exam  After verbal consent patient was treated with HVLA, ME, FPR techniques in cervical, rib, thoracic, lumbar, and sacral  areas  Patient tolerated the procedure well with improvement in symptoms  Patient given exercises, stretches and lifestyle modifications  See medications in patient instructions if given  Patient will follow up in 4-8 weeks     The above documentation has been reviewed and is accurate and complete Judi Saa, DO         Note: This dictation was  prepared with Dragon dictation along with smaller phrase technology. Any transcriptional errors that result from this process are unintentional.

## 2023-08-31 ENCOUNTER — Other Ambulatory Visit: Payer: Self-pay

## 2023-08-31 ENCOUNTER — Encounter: Payer: Self-pay | Admitting: Family Medicine

## 2023-08-31 ENCOUNTER — Ambulatory Visit: Payer: Commercial Managed Care - PPO | Admitting: Family Medicine

## 2023-08-31 VITALS — BP 102/62 | HR 67 | Ht 64.0 in | Wt 159.0 lb

## 2023-08-31 DIAGNOSIS — M9904 Segmental and somatic dysfunction of sacral region: Secondary | ICD-10-CM

## 2023-08-31 DIAGNOSIS — M9901 Segmental and somatic dysfunction of cervical region: Secondary | ICD-10-CM | POA: Diagnosis not present

## 2023-08-31 DIAGNOSIS — M9902 Segmental and somatic dysfunction of thoracic region: Secondary | ICD-10-CM | POA: Diagnosis not present

## 2023-08-31 DIAGNOSIS — M67874 Other specified disorders of tendon, left ankle and foot: Secondary | ICD-10-CM

## 2023-08-31 DIAGNOSIS — M7711 Lateral epicondylitis, right elbow: Secondary | ICD-10-CM | POA: Diagnosis not present

## 2023-08-31 DIAGNOSIS — M9908 Segmental and somatic dysfunction of rib cage: Secondary | ICD-10-CM

## 2023-08-31 DIAGNOSIS — M542 Cervicalgia: Secondary | ICD-10-CM | POA: Diagnosis not present

## 2023-08-31 DIAGNOSIS — M9903 Segmental and somatic dysfunction of lumbar region: Secondary | ICD-10-CM

## 2023-08-31 NOTE — Assessment & Plan Note (Signed)
Worsening symptoms with patient not being quite as active.  Would like to see how patient responds to this and has had all activities including formal physical therapy, bracing, icing regimen, heel lifts.  Will see how shockwave therapy works for this individual.  Follow-up with me again in 6 to 8 weeks otherwise.

## 2023-08-31 NOTE — Patient Instructions (Signed)
Good to see you Shockwave 1/2 time on elbow and 1/2 time on ankle See me again in 6-8 weeks

## 2023-08-31 NOTE — Assessment & Plan Note (Signed)
Will try shockwave as well.  ?PRP

## 2023-08-31 NOTE — Assessment & Plan Note (Signed)
Discussed HEP  Discussed which activities to avoid.  Keep working on posture

## 2023-09-05 ENCOUNTER — Encounter: Payer: Self-pay | Admitting: Family Medicine

## 2023-09-05 ENCOUNTER — Other Ambulatory Visit: Payer: Self-pay

## 2023-09-05 MED ORDER — DOXYCYCLINE HYCLATE 100 MG PO TABS
100.0000 mg | ORAL_TABLET | Freq: Two times a day (BID) | ORAL | 0 refills | Status: AC
  Filled 2023-09-05: qty 14, 7d supply, fill #0

## 2023-09-08 ENCOUNTER — Encounter: Payer: Self-pay | Admitting: Family Medicine

## 2023-09-08 ENCOUNTER — Ambulatory Visit: Payer: Commercial Managed Care - PPO | Admitting: Family Medicine

## 2023-09-08 VITALS — BP 100/70 | HR 69 | Ht 64.0 in | Wt 154.0 lb

## 2023-09-08 DIAGNOSIS — M67874 Other specified disorders of tendon, left ankle and foot: Secondary | ICD-10-CM

## 2023-09-08 DIAGNOSIS — M7711 Lateral epicondylitis, right elbow: Secondary | ICD-10-CM | POA: Diagnosis not present

## 2023-09-08 NOTE — Progress Notes (Signed)
   I, Leotis Batter, CMA acting as a scribe for Artist Lloyd, MD.  Stacey Parker is a 53 y.o. female who presents to [facility] today for shockwave consultation for R elbow and L ankle pain. Pt was previously seen by Dr. Claudene on 08/31/23.  Today, pt reports continued elbow pain radiating into the lower arms. Pt locates ankle pain to posterior aspect. Sx off an on x 1 year. Occasional n/t. Heel raises have been somewhat helpful. Plays tennis, sx improve with rest. Current not taking meds for sx. Wears bracing for the elbow while playing tennis. Has changed tennis shoes to [employer] per Dr. Theressa recommendation.   Right lateral epicondylitis and left Achilles tendinitis.   Pertinent review of systems: No fevers or chills  Relevant historical information: Thyroid  disease.   Exam:  BP 100/70   Pulse 69   Ht 5' 4 (1.626 m)   Wt 154 lb (69.9 kg)   SpO2 98%   BMI 26.43 kg/m  General: Well Developed, well nourished, and in no acute distress.   MSK: Right elbow normal-appearing tender palpation at lateral epicondyle.  Left Achilles tendon nodule present about 3 to 4 cm proximal to Achilles tendon insertion.  Mildly tender to palpation.    Lab and Radiology Results                  Extracorporeal Shockwave Therapy Note    Patient is being treated today with ECSWT. Informed consent was obtained and patient tolerated procedure well.   Therapy performed by Artist Lloyd  Condition treated: Right lateral epicondylitis and left Achilles tendinitis Treatment preset used: Modification of standard Energy used: 90 mJ right lateral epicondyle and 120 mJ left Achilles tendon Frequency used: 13-15 Hz Number of pulses: 2000 each location total 4000 pulses. Head Size: Medium Treatment #1 of #4 No charge for today shockwave treatment.   Assessment and Plan: 53 y.o. female with right lateral epicondylitis.  Chronic ongoing issue.  She already is  working on conservative home exercise program.  Plan for shockwave therapy today.  Plan for estimated 4 sessions.  Recheck in 1 week.  Left Achilles tendinitis.  Chronic ongoing issue again has already done a good job of conservative management.  Again plan for shockwave for an estimated of total fourth sessions.  Recheck in 1 week. Today's visit was consultation and initial trial of shockwave.   PDMP not reviewed this encounter. No orders of the defined types were placed in this encounter.  No orders of the defined types were placed in this encounter.    Discussed warning signs or symptoms. Please see discharge instructions. Patient expresses understanding.   The above documentation has been reviewed and is accurate and complete Artist Lloyd, M.D.

## 2023-09-08 NOTE — Patient Instructions (Signed)
 Return in about 1 week.  Plan for total of 4 sessions of shockwave.

## 2023-09-18 ENCOUNTER — Ambulatory Visit (INDEPENDENT_AMBULATORY_CARE_PROVIDER_SITE_OTHER): Payer: Self-pay | Admitting: Family Medicine

## 2023-09-18 DIAGNOSIS — M67874 Other specified disorders of tendon, left ankle and foot: Secondary | ICD-10-CM

## 2023-09-18 DIAGNOSIS — M7711 Lateral epicondylitis, right elbow: Secondary | ICD-10-CM

## 2023-09-18 NOTE — Progress Notes (Signed)
   [facility] [address] [place] [address] Phone: [phone]   Extracorporeal Shockwave Therapy Note    Patient is being treated today with ECSWT. Informed consent was obtained and patient tolerated procedure well.   Therapy performed by Clementeen Graham  Condition treated: Right lateral epicondylitis and left Achilles tendinitis Treatment preset used: Default Energy used: 90 mJ right lateral elbow and 120 mJ left Achilles tendon Frequency used: 15 Hz Number of pulses: 4000 total.  2000 each site Head Size: Medium Treatment #2 of #4  Electronically signed by:  [facility] 10:20 AM 09/18/23

## 2023-09-25 ENCOUNTER — Ambulatory Visit: Payer: Self-pay | Admitting: Family Medicine

## 2023-09-25 DIAGNOSIS — M67874 Other specified disorders of tendon, left ankle and foot: Secondary | ICD-10-CM

## 2023-09-25 DIAGNOSIS — M7711 Lateral epicondylitis, right elbow: Secondary | ICD-10-CM

## 2023-09-25 NOTE — Progress Notes (Signed)
   [facility] [address] [place] [address] Phone: [phone]   Extracorporeal Shockwave Therapy Note    Patient is being treated today with ECSWT. Informed consent was obtained and patient tolerated procedure well.   Therapy performed by Clementeen Graham  Condition treated: Right lateral epicondylitis and left chronic Achilles tendinitis Treatment preset used: Standard Energy used: 100 mJ right lateral elbow and 120 mJ left Achilles tendon Frequency used: 15 Hz Number of pulses: 4000 total.  2000 each side Head Size: Medium Treatment #3 of #4  Electronically signed by:  [facility] 10:47 AM 09/25/23

## 2023-09-27 DIAGNOSIS — H524 Presbyopia: Secondary | ICD-10-CM | POA: Diagnosis not present

## 2023-10-02 ENCOUNTER — Ambulatory Visit: Payer: Self-pay | Admitting: Family Medicine

## 2023-10-02 DIAGNOSIS — M7711 Lateral epicondylitis, right elbow: Secondary | ICD-10-CM

## 2023-10-02 DIAGNOSIS — M67874 Other specified disorders of tendon, left ankle and foot: Secondary | ICD-10-CM

## 2023-10-02 NOTE — Progress Notes (Signed)
   [facility] [address] [place] [address] Phone: [phone]   Extracorporeal Shockwave Therapy Note    Patient is being treated today with ECSWT. Informed consent was obtained and patient tolerated procedure well.   Therapy performed by Clementeen Graham  Condition treated: Right lateral epicondylitis and left Achilles tendinitis Treatment preset used: Tendinitis Energy used: 100 mJ right elbow and 120 left ankle Frequency used: 15 Hz Number of pulses: 4000 total 2000 each side Head Size: Medium Treatment #4 of #4 Improving with treatment especially left Achilles tendon.  Watchful waiting for now.  Electronically signed by:  [facility] 10:26 AM 10/02/23

## 2023-10-12 NOTE — Progress Notes (Unsigned)
  Stacey Parker [address] [place] [address] Phone: [phone] Subjective:   INadine Parker, am serving as a scribe for Dr. Antoine Primas.  I'm seeing this patient by the request  of:  Patient, No Pcp Per  CC: back and neck pain follow up   [id number]  Stacey Parker is a 53 y.o. female coming in with complaint of back and neck pain. OMT on 08/31/2023. F/u on ankle pain. Patient states  ankle is much better, but elbow still problematic.           Reviewed prior external information including notes and imaging from previsou exam, outside providers and external EMR if available.   As well as notes that were available from care everywhere and other healthcare systems.  Past medical history, social, surgical and family history all reviewed in electronic medical record.  No pertanent information unless stated regarding to the chief complaint.   Past Medical History:  Diagnosis Date   Hashimoto's thyroiditis    Polycystic ovary     Allergies  Allergen Reactions   Sulfa Antibiotics Other (See Comments)     Review of Systems:  No headache, visual changes, nausea, vomiting, diarrhea, constipation, dizziness, abdominal pain, skin rash, fevers, chills, night sweats, weight loss, swollen lymph nodes, body aches, joint swelling, chest pain, shortness of breath, mood changes. POSITIVE muscle aches  Objective  Blood pressure 102/64, pulse 65, height 5\' 4"  (1.626 m), SpO2 98%.   General: No apparent distress alert and oriented x3 mood and affect normal, dressed appropriately.  HEENT: Pupils equal, extraocular movements intact  Respiratory: Patient's speak in full sentences and does not appear short of breath  Cardiovascular: No lower extremity edema, non tender, no erythema  Gait MSK:  Back does have some loss lordosis noted.  Neck exam does have some tightness noted right greater than left.  Still some tenderness over the lateral  epicondylar area on the right side.  Osteopathic findings  C2 flexed rotated and side bent right C6 flexed rotated and side bent left C7 flexed rotated and side bent right T3 extended rotated and side bent right inhaled rib T9 extended rotated and side bent left T11 flexed rotated and side bent right L2 flexed rotated and side bent right L3 flexed rotated and side bent left Sacrum right on right       Assessment and Plan:  No problem-specific Assessment & Plan notes found for this encounter.    Nonallopathic problems  Decision today to treat with OMT was based on Physical Exam  After verbal consent patient was treated with HVLA, ME, FPR techniques in cervical, rib, thoracic, lumbar, and sacral  areas  Patient tolerated the procedure well with improvement in symptoms  Patient given exercises, stretches and lifestyle modifications  See medications in patient instructions if given  Patient will follow up in 4-8 weeks     The above documentation has been reviewed and is accurate and complete Judi Saa, DO         Note: This dictation was prepared with Dragon dictation along with smaller phrase technology. Any transcriptional errors that result from this process are unintentional.

## 2023-10-17 ENCOUNTER — Encounter: Payer: Self-pay | Admitting: Family Medicine

## 2023-10-17 ENCOUNTER — Ambulatory Visit: Payer: Commercial Managed Care - PPO | Admitting: Family Medicine

## 2023-10-17 VITALS — BP 102/64 | HR 65 | Ht 64.0 in

## 2023-10-17 DIAGNOSIS — M9904 Segmental and somatic dysfunction of sacral region: Secondary | ICD-10-CM | POA: Diagnosis not present

## 2023-10-17 DIAGNOSIS — M542 Cervicalgia: Secondary | ICD-10-CM

## 2023-10-17 DIAGNOSIS — M9901 Segmental and somatic dysfunction of cervical region: Secondary | ICD-10-CM | POA: Diagnosis not present

## 2023-10-17 DIAGNOSIS — M9902 Segmental and somatic dysfunction of thoracic region: Secondary | ICD-10-CM | POA: Diagnosis not present

## 2023-10-17 DIAGNOSIS — M9908 Segmental and somatic dysfunction of rib cage: Secondary | ICD-10-CM | POA: Diagnosis not present

## 2023-10-17 DIAGNOSIS — M9903 Segmental and somatic dysfunction of lumbar region: Secondary | ICD-10-CM | POA: Diagnosis not present

## 2023-10-17 NOTE — Patient Instructions (Signed)
 Good to see you! Where the brace 3 to 4 finger breathes below the bone See you again in 6-8 weeks

## 2023-10-17 NOTE — Assessment & Plan Note (Signed)
 Patient has had knee pain previously.  Discussed icing regimen and home exercises, which activities to do and which ones to avoid.  Increase activity slowly.  Continue work on [employer] otherwise.  Follow-up again in 6 to 8 weeks

## 2023-11-20 ENCOUNTER — Other Ambulatory Visit: Payer: Self-pay | Admitting: Family Medicine

## 2023-11-20 ENCOUNTER — Other Ambulatory Visit: Payer: Self-pay

## 2023-11-20 MED ORDER — GABAPENTIN 300 MG PO CAPS
300.0000 mg | ORAL_CAPSULE | Freq: Every day | ORAL | 0 refills | Status: DC
  Filled 2023-11-20 – 2023-11-24 (×2): qty 90, 90d supply, fill #0

## 2023-11-22 ENCOUNTER — Other Ambulatory Visit: Payer: Self-pay

## 2023-11-24 ENCOUNTER — Other Ambulatory Visit: Payer: Self-pay

## 2023-12-04 ENCOUNTER — Other Ambulatory Visit: Payer: Self-pay

## 2023-12-06 DIAGNOSIS — E559 Vitamin D deficiency, unspecified: Secondary | ICD-10-CM | POA: Diagnosis not present

## 2023-12-06 DIAGNOSIS — E063 Autoimmune thyroiditis: Secondary | ICD-10-CM | POA: Diagnosis not present

## 2023-12-06 DIAGNOSIS — E282 Polycystic ovarian syndrome: Secondary | ICD-10-CM | POA: Diagnosis not present

## 2023-12-06 NOTE — Progress Notes (Signed)
 [facility] [address] [place] [address] Phone: [phone] Subjective:   Stacey Parker, am serving as a scribe for Dr. Ronnell Coins.  I'm seeing this patient by the request  of:  Patient, No Pcp Per  CC: Ankle pain, foot pain, neck pain  [id number]  Stacey Parker is a 53 y.o. female coming in with complaint of back and neck pain. OMT on 10/17/2023. Patient states that shockwave was helpful. Will have a twinge of pain here and there. Still working on [employer].   Elbow is doing well. Wears brace. Tried a new racket that caused pain so she went back to old racket.   Neck is tight.   Also c/o pain B feet. R>L. Pain on bottom of heel. Has tried stretching at home which has been helpful.   Medications patient has been prescribed: gabapentin   Taking:         Reviewed prior external information including notes and imaging from previsou exam, outside providers and external EMR if available.   As well as notes that were available from care everywhere and other healthcare systems.  Past medical history, social, surgical and family history all reviewed in electronic medical record.  No pertanent information unless stated regarding to the chief complaint.   Past Medical History:  Diagnosis Date   Hashimoto's thyroiditis    Polycystic ovary     Allergies  Allergen Reactions   Sulfa Antibiotics Other (See Comments)     Review of Systems:  No headache, visual changes, nausea, vomiting, diarrhea, constipation, dizziness, abdominal pain, skin rash, fevers, chills, night sweats, weight loss, swollen lymph nodes, body aches, joint swelling, chest pain, shortness of breath, mood changes. POSITIVE muscle aches  Objective  Blood pressure 112/72, pulse 63, height 5\' 4"  (1.626 m), weight 154 lb (69.9 kg), SpO2 97%.   General: No apparent distress alert and oriented x3 mood and affect normal, dressed appropriately.  HEENT: Pupils equal,  extraocular movements intact  Respiratory: Patient's speak in full sentences and does not appear short of breath  Cardiovascular: No lower extremity edema, non tender, no erythema  Gait normal  MSK:  Back relatively good but does have some tightness noted in the thoracolumbar junction.  Patient does have tightness at the neck with sidebending.  Negative Spurling's noted.  Osteopathic findings  C2 flexed rotated and side bent right C5 flexed rotated and side bent left T3 extended rotated and side bent right inhaled rib T9 extended rotated and side bent left L3 flexed rotated and side bent right Sacrum right on right       Assessment and Plan:  Achilles tendinosis of left ankle Significant improvement noted.  Full range of motion and is nontender.  Cervical pain (neck) Chronic problem that is multifactorial.  Discussed icing regimen and home exercises.  Discussed which activities to do and which ones to avoid.  Discussed posture and ergonomics.  Increase activity slowly.  Follow-up again in 6 to 8 weeks    Nonallopathic problems  Decision today to treat with OMT was based on Physical Exam  After verbal consent patient was treated with HVLA, ME, FPR techniques in cervical, rib, thoracic, lumbar, and sacral  areas  Patient tolerated the procedure well with improvement in symptoms  Patient given exercises, stretches and lifestyle modifications  See medications in patient instructions if given  Patient will follow up in 4-8 weeks     The above documentation has been reviewed and is accurate and  complete Stacey Parker M Demiana Crumbley, DO         Note: This dictation was prepared with Dragon dictation along with smaller phrase technology. Any transcriptional errors that result from this process are unintentional.

## 2023-12-07 ENCOUNTER — Ambulatory Visit: Admitting: Family Medicine

## 2023-12-07 ENCOUNTER — Other Ambulatory Visit: Payer: Self-pay

## 2023-12-07 VITALS — BP 112/72 | HR 63 | Ht 64.0 in | Wt 154.0 lb

## 2023-12-07 DIAGNOSIS — M9902 Segmental and somatic dysfunction of thoracic region: Secondary | ICD-10-CM

## 2023-12-07 DIAGNOSIS — L732 Hidradenitis suppurativa: Secondary | ICD-10-CM | POA: Diagnosis not present

## 2023-12-07 DIAGNOSIS — M9908 Segmental and somatic dysfunction of rib cage: Secondary | ICD-10-CM

## 2023-12-07 DIAGNOSIS — M542 Cervicalgia: Secondary | ICD-10-CM | POA: Diagnosis not present

## 2023-12-07 DIAGNOSIS — M9903 Segmental and somatic dysfunction of lumbar region: Secondary | ICD-10-CM

## 2023-12-07 DIAGNOSIS — M9901 Segmental and somatic dysfunction of cervical region: Secondary | ICD-10-CM | POA: Diagnosis not present

## 2023-12-07 DIAGNOSIS — M9904 Segmental and somatic dysfunction of sacral region: Secondary | ICD-10-CM

## 2023-12-07 DIAGNOSIS — M67874 Other specified disorders of tendon, left ankle and foot: Secondary | ICD-10-CM

## 2023-12-07 MED ORDER — DOXYCYCLINE HYCLATE 100 MG PO TABS
100.0000 mg | ORAL_TABLET | Freq: Two times a day (BID) | ORAL | 0 refills | Status: DC
  Filled 2023-12-07: qty 14, 7d supply, fill #0

## 2023-12-07 MED ORDER — FLUCONAZOLE 200 MG PO TABS
200.0000 mg | ORAL_TABLET | Freq: Every day | ORAL | 0 refills | Status: AC
  Filled 2023-12-07: qty 5, 5d supply, fill #0

## 2023-12-07 NOTE — Patient Instructions (Signed)
 Doxy BID for one week Diflucan  daily for 5 days See me again in 6-8 weeks

## 2023-12-08 ENCOUNTER — Encounter: Payer: Self-pay | Admitting: Family Medicine

## 2023-12-08 DIAGNOSIS — L732 Hidradenitis suppurativa: Secondary | ICD-10-CM | POA: Insufficient documentation

## 2023-12-08 NOTE — Assessment & Plan Note (Signed)
 Significant improvement noted.  Full range of motion and is nontender.

## 2023-12-08 NOTE — Assessment & Plan Note (Signed)
 Rash noted, some of the dosing used leg will do doxycycline  and Diflucan 

## 2023-12-08 NOTE — Assessment & Plan Note (Signed)
 Chronic problem that is multifactorial.  Discussed icing regimen and home exercises.  Discussed which activities to do and which ones to avoid.  Discussed posture and ergonomics.  Increase activity slowly.  Follow-up again in 6 to 8 weeks

## 2024-01-01 ENCOUNTER — Other Ambulatory Visit: Payer: Self-pay

## 2024-01-01 ENCOUNTER — Encounter

## 2024-01-01 ENCOUNTER — Encounter: Payer: Self-pay | Admitting: Family Medicine

## 2024-01-01 MED ORDER — DOXYCYCLINE HYCLATE 100 MG PO TABS
100.0000 mg | ORAL_TABLET | Freq: Two times a day (BID) | ORAL | 0 refills | Status: DC
  Filled 2024-01-01: qty 14, 7d supply, fill #0

## 2024-01-09 ENCOUNTER — Telehealth: Admitting: Physician Assistant

## 2024-01-09 ENCOUNTER — Other Ambulatory Visit: Payer: Self-pay

## 2024-01-09 DIAGNOSIS — J029 Acute pharyngitis, unspecified: Secondary | ICD-10-CM | POA: Diagnosis not present

## 2024-01-09 MED ORDER — AZITHROMYCIN 250 MG PO TABS
ORAL_TABLET | ORAL | 0 refills | Status: AC
  Filled 2024-01-09: qty 6, 5d supply, fill #0

## 2024-01-09 NOTE — Progress Notes (Signed)
 Virtual Visit Consent   Stacey Parker, you are scheduled for a virtual visit with a [facility] provider today. Just as with appointments in the office, your consent must be obtained to participate. Your consent will be active for this visit and any virtual visit you may have with one of our providers in the next 365 days. If you have a MyChart account, a copy of this consent can be sent to you electronically.  As this is a virtual visit, video technology does not allow for your provider to perform a traditional examination. This may limit your provider's ability to fully assess your condition. If your provider identifies any concerns that need to be evaluated in person or the need to arrange testing (such as labs, EKG, etc.), we will make arrangements to do so. Although advances in technology are sophisticated, we cannot ensure that it will always work on either your end or our end. If the connection with a video visit is poor, the visit may have to be switched to a telephone visit. With either a video or telephone visit, we are not always able to ensure that we have a secure connection.  By engaging in this virtual visit, you consent to the provision of healthcare and authorize for your insurance to be billed (if applicable) for the services provided during this visit. Depending on your insurance coverage, you may receive a charge related to this service.  I need to obtain your verbal consent now. Are you willing to proceed with your visit today? Stacey Parker has provided verbal consent on 01/09/2024 for a virtual visit (video or telephone). Stacey Parker, [place]  Date: 01/09/2024 5:10 PM   Virtual Visit via Video Note   I, Stacey Parker, connected with  Stacey Parker  ([id number], 04-27-1971) on 01/09/24 at  5:00 PM EDT by a video-enabled telemedicine application and verified that I am speaking with the correct person using two identifiers.  Location: Patient: Virtual  Visit Location Patient: Home Provider: Virtual Visit Location Provider: Home Office   I discussed the limitations of evaluation and management by telemedicine and the availability of in person appointments. The patient expressed understanding and agreed to proceed.    History of Present Illness: Stacey Parker is a 53 y.o. who identifies as a female who was assigned female at birth, and is being seen today for throat pain, fatigue and tender neck lymph nodes starting 2 days ago. Notes low-grade fever (intermittent) and mild cough with congestion. Denies sinus pain or facial pain. Denies noting swollen tonsils or exudates. Denies known exposure to strep. Denies recent travel.   OTC -- Tylenol and Robitussin.  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   Achilles tendinosis of left ankle 11/07/2022   Retrocalcaneal bursitis (back of heel), left 09/15/2022   Left elbow pain 10/20/2021   Cervical pain (neck) 08/05/2020   Low back pain 11/26/2019   Nonallopathic lesion of lumbosacral region 05/16/2019   Nonallopathic lesion of sacral region 05/16/2019   Nonallopathic lesion of thoracic region 05/16/2019   Lateral epicondylitis of right elbow 04/05/2019   Greater trochanteric bursitis of right hip 04/05/2019   Hashimoto's thyroiditis 05/10/2016   PCOS (polycystic ovarian syndrome) 03/27/2015   Irregular menses 03/27/2015    Allergies:  Allergies  Allergen Reactions   Sulfa Antibiotics Other (See Comments)   Medications:  Current Outpatient Medications:    albuterol  (VENTOLIN  HFA) 108 (  90 Base) MCG/ACT inhaler, Inhale 1-2 puffs into the lungs every 6 (six) hours as needed for wheezing or shortness of breath. And before physical activity, Disp: 6.7 g, Rfl: 0   albuterol  (VENTOLIN  HFA) 108 (90 Base) MCG/ACT inhaler, Inhale 1-2 puffs into the lungs every 6 (six) hours as needed for wheezing or shortness of breath. And  before physical activity, Disp: 6.7 g, Rfl: 2   brompheniramine-pseudoephedrine-DM 30-2-10 MG/5ML syrup, Take 5 mLs by mouth 4 (four) times daily as needed., Disp: 118 mL, Rfl: 0   budesonide -formoterol  (SYMBICORT ) 80-4.5 MCG/ACT inhaler, Inhale 2 puffs into the lungs 2 (two) times daily., Disp: 10.2 g, Rfl: 0   COVID-19 At Home Antigen Test (CARESTART COVID-19 HOME TEST) KIT, Use as directed, Disp: 2 kit, Rfl: 0   Diclofenac  Sodium (PENNSAID ) 2 % SOLN, Place 2 g onto the skin 2 (two) times daily., Disp: 112 g, Rfl: 3   Diclofenac  Sodium 2 % SOLN, Place 2 g onto the skin 2 (two) times daily., Disp: 112 g, Rfl: 3   estradiol  (VIVELLE -DOT) 0.0375 MG/24HR, Place 1 patch onto the skin and change twice weekly, Disp: 8 patch, Rfl: 5   estradiol  (VIVELLE -DOT) 0.0375 MG/24HR, Change patch twice weekly, Disp: 24 patch, Rfl: 1   estradiol  (VIVELLE -DOT) 0.05 MG/24HR patch, APPLY 1 PATCH AND CHANGE TWICE WEEKLY, Disp: 8 patch, Rfl: 5   estradiol  (VIVELLE -DOT) 0.05 MG/24HR patch, APPLY PATCH TWICE WEEKLY, Disp: 8 patch, Rfl: 3   estradiol  (VIVELLE -DOT) 0.05 MG/24HR patch, Change patch twice weekly, Disp: 24 patch, Rfl: 1   estradiol  (VIVELLE -DOT) 0.05 MG/24HR patch, Place 1 patch (0.05 mg total) onto the skin 2 (two) times a week., Disp: 24 patch, Rfl: 1   estradiol  (VIVELLE -DOT) 0.05 MG/24HR patch, Place 1 patch (0.05 mg total) onto the skin 2 (two) times a week., Disp: 24 patch, Rfl: 1   estradiol  (VIVELLE -DOT) 0.05 MG/24HR patch, Place 1 patch (0.05 mg total) onto the skin 2 (two) times a week., Disp: 24 patch, Rfl: 1   estradiol  (VIVELLE -DOT) 0.05 MG/24HR patch, Place 1 patch (0.05 mg total) onto the skin 2 (two) times a week., Disp: 24 patch, Rfl: 1   fluconazole  (DIFLUCAN ) 200 MG tablet, Take 1 tablet (200 mg total) by mouth daily., Disp: 5 tablet, Rfl: 0   fluticasone  (FLONASE ) 50 MCG/ACT nasal spray, Place 2 sprays into both nostrils daily., Disp: 16 g, Rfl: 0   gabapentin  (NEURONTIN ) 100 MG capsule,  Take 2 capsules (200 mg total) by mouth at bedtime., Disp: 180 capsule, Rfl: 0   gabapentin  (NEURONTIN ) 300 MG capsule, Take 1 capsule (300 mg total) by mouth at bedtime., Disp: 90 capsule, Rfl: 0   influenza vac split quadrivalent PF (FLUARIX QUADRIVALENT ) 0.5 ML injection, Inject into the muscle., Disp: 0.5 mL, Rfl: 0   levothyroxine  (SYNTHROID ) 50 MCG tablet, Take 1 tablet (50 mcg total) by mouth daily on an empty stomach, Disp: 90 tablet, Rfl: 1   meloxicam  (MOBIC ) 7.5 MG tablet, Take 1 tablet (7.5 mg total) by mouth daily., Disp: 30 tablet, Rfl: 0   miconazole  (MICATIN) 2 % cream, Apply 1 application topically 2 (two) times daily., Disp: 28.35 g, Rfl: 1   nitroGLYCERIN  (NITRO-DUR ) 0.2 mg/hr patch, Apply 1/4 of a patch to skin once daily., Disp: 30 patch, Rfl: 0   progesterone  (PROMETRIUM ) 100 MG capsule, Take 1 capsule (100 mg total) by mouth at bedtime, Disp: 90 capsule, Rfl: 3   Progesterone  Micronized (PROGESTERONE  PO), Take by mouth. Take on days 12-26 through of  cycle, Disp: , Rfl:    SYNTHROID  50 MCG tablet, , Disp: , Rfl: 1   SYNTHROID  50 MCG tablet, TAKE 1 TABLET BY MOUTH ONCE DAILY ON AN EMPTY STOMACH, Disp: 90 tablet, Rfl: 1  Observations/Objective: Patient is well-developed, well-nourished in no acute distress.  Resting comfortably  at home.  Head is normocephalic, atraumatic.  No labored breathing. Speech is clear and coherent with logical content.  Patient is alert and oriented at baseline.  Mild oropharyngeal erythema without edema. No tonsillar edema or exudate appreciated. Uvula is midline and without lesion or edema.  Assessment and Plan: 1. Sore throat (Primary)  Discussed most likely viral etiology giving absence of high fever, no tonsillar exudate and presence of cough. Supportive measures and OTC medications reviewed. Will put on file at pharmacy, an antibiotic to cover for bacterial pharyngitis if she notes any worsening throat, fever or start of tonsillar  exudates.  Follow Up Instructions: I discussed the assessment and treatment plan with the patient. The patient was provided an opportunity to ask questions and all were answered. The patient agreed with the plan and demonstrated an understanding of the instructions.  A copy of instructions were sent to the patient via MyChart unless otherwise noted below.    The patient was advised to call back or seek an in-person evaluation if the symptoms worsen or if the condition fails to improve as anticipated.    Stacey Maillard, PA-C

## 2024-01-09 NOTE — Patient Instructions (Signed)
 Stacey Parker, thank you for joining [name], PA-C for today's virtual visit.  While this provider is not your primary care provider (PCP), if your PCP is located in our provider database this encounter information will be shared with them immediately following your visit.   A [facility] MyChart account gives you access to today's visit and all your visits, tests, and labs performed at [facility] " click here if you don't have a [facility] MyChart account or go to [email or web address]  Consent: (Patient) Stacey Parker provided verbal consent for this virtual visit at the beginning of the encounter.  Current Medications:  Current Outpatient Medications:    albuterol  (VENTOLIN  HFA) 108 (90 Base) MCG/ACT inhaler, Inhale 1-2 puffs into the lungs every 6 (six) hours as needed for wheezing or shortness of breath. And before physical activity, Disp: 6.7 g, Rfl: 0   albuterol  (VENTOLIN  HFA) 108 (90 Base) MCG/ACT inhaler, Inhale 1-2 puffs into the lungs every 6 (six) hours as needed for wheezing or shortness of breath. And before physical activity, Disp: 6.7 g, Rfl: 2   brompheniramine-pseudoephedrine-DM 30-2-10 MG/5ML syrup, Take 5 mLs by mouth 4 (four) times daily as needed., Disp: 118 mL, Rfl: 0   budesonide -formoterol  (SYMBICORT ) 80-4.5 MCG/ACT inhaler, Inhale 2 puffs into the lungs 2 (two) times daily., Disp: 10.2 g, Rfl: 0   COVID-19 At Home Antigen Test (CARESTART COVID-19 HOME TEST) KIT, Use as directed, Disp: 2 kit, Rfl: 0   Diclofenac  Sodium (PENNSAID ) 2 % SOLN, Place 2 g onto the skin 2 (two) times daily., Disp: 112 g, Rfl: 3   Diclofenac  Sodium 2 % SOLN, Place 2 g onto the skin 2 (two) times daily., Disp: 112 g, Rfl: 3   doxycycline  (VIBRA -TABS) 100 MG tablet, Take 1 tablet (100 mg total) by mouth 2 (two) times daily., Disp: 14 tablet, Rfl: 0   estradiol  (VIVELLE -DOT) 0.0375 MG/24HR, Place 1 patch onto the skin and change twice weekly, Disp: 8 patch,  Rfl: 5   estradiol  (VIVELLE -DOT) 0.0375 MG/24HR, Change patch twice weekly, Disp: 24 patch, Rfl: 1   estradiol  (VIVELLE -DOT) 0.05 MG/24HR patch, APPLY 1 PATCH AND CHANGE TWICE WEEKLY, Disp: 8 patch, Rfl: 5   estradiol  (VIVELLE -DOT) 0.05 MG/24HR patch, APPLY PATCH TWICE WEEKLY, Disp: 8 patch, Rfl: 3   estradiol  (VIVELLE -DOT) 0.05 MG/24HR patch, Change patch twice weekly, Disp: 24 patch, Rfl: 1   estradiol  (VIVELLE -DOT) 0.05 MG/24HR patch, Place 1 patch (0.05 mg total) onto the skin 2 (two) times a week., Disp: 24 patch, Rfl: 1   estradiol  (VIVELLE -DOT) 0.05 MG/24HR patch, Place 1 patch (0.05 mg total) onto the skin 2 (two) times a week., Disp: 24 patch, Rfl: 1   estradiol  (VIVELLE -DOT) 0.05 MG/24HR patch, Place 1 patch (0.05 mg total) onto the skin 2 (two) times a week., Disp: 24 patch, Rfl: 1   estradiol  (VIVELLE -DOT) 0.05 MG/24HR patch, Place 1 patch (0.05 mg total) onto the skin 2 (two) times a week., Disp: 24 patch, Rfl: 1   fluconazole  (DIFLUCAN ) 200 MG tablet, Take 1 tablet (200 mg total) by mouth daily., Disp: 5 tablet, Rfl: 0   fluticasone  (FLONASE ) 50 MCG/ACT nasal spray, Place 2 sprays into both nostrils daily., Disp: 16 g, Rfl: 0   gabapentin  (NEURONTIN ) 100 MG capsule, Take 2 capsules (200 mg total) by mouth at bedtime., Disp: 180 capsule, Rfl: 0   gabapentin  (NEURONTIN ) 300 MG capsule, Take 1 capsule (300 mg total) by mouth at bedtime., Disp: 90 capsule, Rfl: 0  influenza vac split quadrivalent PF (FLUARIX QUADRIVALENT ) 0.5 ML injection, Inject into the muscle., Disp: 0.5 mL, Rfl: 0   levothyroxine  (SYNTHROID ) 50 MCG tablet, Take 1 tablet (50 mcg total) by mouth daily on an empty stomach, Disp: 90 tablet, Rfl: 1   meloxicam  (MOBIC ) 7.5 MG tablet, Take 1 tablet (7.5 mg total) by mouth daily., Disp: 30 tablet, Rfl: 0   miconazole  (MICATIN) 2 % cream, Apply 1 application topically 2 (two) times daily., Disp: 28.35 g, Rfl: 1   nitroGLYCERIN  (NITRO-DUR ) 0.2 mg/hr patch, Apply 1/4 of a patch  to skin once daily., Disp: 30 patch, Rfl: 0   oseltamivir  (TAMIFLU ) 75 MG capsule, Take 1 capsule (75 mg total) by mouth 2 (two) times daily., Disp: 10 capsule, Rfl: 0   progesterone  (PROMETRIUM ) 100 MG capsule, Take 1 capsule (100 mg total) by mouth at bedtime, Disp: 90 capsule, Rfl: 3   Progesterone  Micronized (PROGESTERONE  PO), Take by mouth. Take on days 12-26 through of cycle, Disp: , Rfl:    SYNTHROID  50 MCG tablet, , Disp: , Rfl: 1   SYNTHROID  50 MCG tablet, TAKE 1 TABLET BY MOUTH ONCE DAILY ON AN EMPTY STOMACH, Disp: 90 tablet, Rfl: 1   Medications ordered in this encounter:  No orders of the defined types were placed in this encounter.    *If you need refills on other medications prior to your next appointment, please contact your pharmacy*  Follow-Up: Call back or seek an in-person evaluation if the symptoms worsen or if the condition fails to improve as anticipated.  [facility] [phone]  Other Instructions Please hydrate and rest. Start salt-water gargles. If you have a humidifier, place it in the bedroom and run at night. Ok to alternate Tylenol and Ibuprofen OTC short-term for pain.  Symptoms should level out and improve over next few days. If you note fever > 101, worsening lymph node tenderness and/or pus in the back of the throat, please start the Azithromycin, taking as directed.   If you have been instructed to have an in-person evaluation today at a local Urgent Care facility, please use the link below. It will take you to a list of all of our available [facility], including address, phone number and hours of operation. Please do not delay care.  [facility]  If you or a family member do not have a primary care provider, use the link below to schedule a visit and establish care. When you choose a [facility] primary care physician or advanced practice provider, you gain a long-term partner in health. Find a Primary Care  Provider  Learn more about [facility]'s in-office and virtual care options:  - Get Care Now

## 2024-01-15 NOTE — Progress Notes (Signed)
  [facility] [address] [place] [address] Phone: [phone] Subjective:   Stacey Parker, am serving as a scribe for Dr. Arthea Claudene.  I'm seeing this patient by the request  of:  Patient, No Pcp Per  CC: Back and neck pain follow-up  YEP:Dlagzrupcz  Stacey Parker is a 53 y.o. female coming in with complaint of back and neck pain. OMT 12/07/2023. Patient states that she is doing well. Elbow and neck have not been bothering her.   Medications patient has been prescribed: Gabapentin , Diflucan   Taking: Yes         Reviewed prior external information including notes and imaging from previsou exam, outside providers and external EMR if available.   As well as notes that were available from care everywhere and other healthcare systems.  Past medical history, social, surgical and family history all reviewed in electronic medical record.  No pertanent information unless stated regarding to the chief complaint.   Past Medical History:  Diagnosis Date   Hashimoto's thyroiditis    Polycystic ovary     Allergies  Allergen Reactions   Sulfa Antibiotics Other (See Comments)     Review of Systems:  No headache, visual changes, nausea, vomiting, diarrhea, constipation, dizziness, abdominal pain, skin rash, fevers, chills, night sweats, weight loss, swollen lymph nodes, body aches, joint swelling, chest pain, shortness of breath, mood changes. POSITIVE muscle aches  Objective  Blood pressure 102/76, pulse 60, height 5' 4 (1.626 m), weight 157 lb (71.2 kg), SpO2 98%.   General: No apparent distress alert and oriented x3 mood and affect normal, dressed appropriately.  HEENT: Pupils equal, extraocular movements intact  Respiratory: Patient's speak in full sentences and does not appear short of breath  Cardiovascular: No lower extremity edema, non tender, no erythema  Neck exam as soon as possible. Tenderness to palpation in the  paraspinal musculature.  Tightness with Deri right greater than left. Neck exam does have some loss of lordosis minorly.  Negative Spurling's 5-5 strength of the upper extremities  Osteopathic findings  C2 flexed rotated and side bent right C6 flexed rotated and side bent left T3 extended rotated and side bent right inhaled rib T9 extended rotated and side bent left L2 flexed rotated and side bent right Sacrum right on right       Assessment and Plan:  Cervical pain (neck) Tightness noted.  Discussed icing regimen and home exercises, discussed which activities to do and which ones to avoid.  Increase activity slowly.  Discussed icing regimen.  Follow-up again in 6 to 8 weeks otherwise.    Nonallopathic problems  Decision today to treat with OMT was based on Physical Exam  After verbal consent patient was treated with HVLA, ME, FPR techniques in cervical, rib, thoracic, lumbar, and sacral  areas  Patient tolerated the procedure well with improvement in symptoms  Patient given exercises, stretches and lifestyle modifications  See medications in patient instructions if given  Patient will follow up in 4-8 weeks             Note: This dictation was prepared with Dragon dictation along with smaller phrase technology. Any transcriptional errors that result from this process are unintentional.

## 2024-01-16 ENCOUNTER — Encounter: Payer: Self-pay | Admitting: Family Medicine

## 2024-01-16 ENCOUNTER — Ambulatory Visit: Admitting: Family Medicine

## 2024-01-16 VITALS — BP 102/76 | HR 60 | Ht 64.0 in | Wt 157.0 lb

## 2024-01-16 DIAGNOSIS — M9902 Segmental and somatic dysfunction of thoracic region: Secondary | ICD-10-CM

## 2024-01-16 DIAGNOSIS — M9908 Segmental and somatic dysfunction of rib cage: Secondary | ICD-10-CM | POA: Diagnosis not present

## 2024-01-16 DIAGNOSIS — M9904 Segmental and somatic dysfunction of sacral region: Secondary | ICD-10-CM | POA: Diagnosis not present

## 2024-01-16 DIAGNOSIS — M9901 Segmental and somatic dysfunction of cervical region: Secondary | ICD-10-CM | POA: Diagnosis not present

## 2024-01-16 DIAGNOSIS — M9903 Segmental and somatic dysfunction of lumbar region: Secondary | ICD-10-CM

## 2024-01-16 DIAGNOSIS — M542 Cervicalgia: Secondary | ICD-10-CM

## 2024-01-16 NOTE — Patient Instructions (Signed)
 See me again in 2 months

## 2024-01-16 NOTE — Assessment & Plan Note (Signed)
 Tightness noted.  Discussed icing regimen and home exercises, discussed which activities to do and which ones to avoid.  Increase activity slowly.  Discussed icing regimen.  Follow-up again in 6 to 8 weeks otherwise.

## 2024-01-24 ENCOUNTER — Ambulatory Visit
Admission: RE | Admit: 2024-01-24 | Discharge: 2024-01-24 | Disposition: A | Discharge status: Home / Self Care | Source: Ambulatory Visit | Attending: Internal Medicine | Admitting: Internal Medicine

## 2024-01-24 DIAGNOSIS — Z1231 Encounter for screening mammogram for malignant neoplasm of breast: Secondary | ICD-10-CM | POA: Insufficient documentation

## 2024-01-25 ENCOUNTER — Other Ambulatory Visit: Payer: Self-pay

## 2024-02-18 ENCOUNTER — Other Ambulatory Visit: Payer: Self-pay

## 2024-02-18 ENCOUNTER — Other Ambulatory Visit: Payer: Self-pay | Admitting: Family Medicine

## 2024-02-19 ENCOUNTER — Other Ambulatory Visit: Payer: Self-pay

## 2024-02-19 ENCOUNTER — Other Ambulatory Visit: Payer: Self-pay | Admitting: Family Medicine

## 2024-02-19 MED FILL — Gabapentin Cap 100 MG: ORAL | 90 days supply | Qty: 180 | Fill #0 | Status: CN

## 2024-02-19 MED FILL — Gabapentin Cap 300 MG: ORAL | 90 days supply | Qty: 90 | Fill #0 | Status: CN

## 2024-02-22 ENCOUNTER — Other Ambulatory Visit: Payer: Self-pay

## 2024-02-22 MED ORDER — ESTRADIOL 0.05 MG/24HR TD PTTW
1.0000 | MEDICATED_PATCH | TRANSDERMAL | 1 refills | Status: AC
  Filled 2024-02-22 – 2024-05-29 (×2): qty 24, 84d supply, fill #0
  Filled 2024-08-29: qty 24, 84d supply, fill #1

## 2024-02-23 ENCOUNTER — Other Ambulatory Visit: Payer: Self-pay

## 2024-02-23 MED FILL — Gabapentin Cap 300 MG: ORAL | 90 days supply | Qty: 90 | Fill #0 | Status: AC

## 2024-02-28 ENCOUNTER — Other Ambulatory Visit: Payer: Self-pay

## 2024-03-20 NOTE — Progress Notes (Unsigned)
  [facility] [address] [place] [address] Phone: [phone] Subjective:   ISusannah Gully, am serving as a scribe for Dr. Arthea Claudene.  I'm seeing this patient by the request  of:  Patient, No Pcp Per  CC: Back and neck pain follow-up  YEP:Dlagzrupcz  ROSELEE TAYLOE is a 53 y.o. female coming in with complaint of back and neck pain. OMT 01/16/2204. Patient states doing okay since the last visit. No new symptoms.  Medications patient has been prescribed: Gabapentin   Taking:         Reviewed prior external information including notes and imaging from previsou exam, outside providers and external EMR if available.   As well as notes that were available from care everywhere and other healthcare systems.  Past medical history, social, surgical and family history all reviewed in electronic medical record.  No pertanent information unless stated regarding to the chief complaint.   Past Medical History:  Diagnosis Date   Hashimoto's thyroiditis    Polycystic ovary     Allergies  Allergen Reactions   Sulfa Antibiotics Other (See Comments)     Review of Systems:  No headache, visual changes, nausea, vomiting, diarrhea, constipation, dizziness, abdominal pain, skin rash, fevers, chills, night sweats, weight loss, swollen lymph nodes, body aches, joint swelling, chest pain, shortness of breath, mood changes. POSITIVE muscle aches  Objective  Blood pressure 106/70, pulse 64, height 5' 4 (1.626 m), weight 156 lb (70.8 kg), SpO2 98%.   General: No apparent distress alert and oriented x3 mood and affect normal, dressed appropriately.  HEENT: Pupils equal, extraocular movements intact  Respiratory: Patient's speak in full sentences and does not appear short of breath  Cardiovascular: No lower extremity edema, non tender, no erythema  Gait relatively normal MSK:  Back low back does have some loss lordosis.  Neck exam does have some  tenderness noted in the paraspinal musculature. Neck seems to have tightness more on the right side than the left side.  Osteopathic findings  C3 flexed rotated and side bent right C5 flexed rotated and side bent right C7 flexed rotated and side bent right T3 extended rotated and side bent right inhaled rib T6 extended rotated and side bent left L2 flexed rotated and side bent right L3 flexed rotated and side bent left Sacrum right on right     Assessment and Plan:  Cervical pain (neck) Discussed, the aggravation seems to be secondary to more of the patient's weight at this time and discussed icing regimen and home exercises otherwise.  Will continue to work on core stability as well as strengthening with patient having the hypermobility.  Patient will follow-up again 6 to 8 weeks.    Nonallopathic problems  Decision today to treat with OMT was based on Physical Exam  After verbal consent patient was treated with HVLA, ME, FPR techniques in cervical, rib, thoracic, lumbar, and sacral  areas  Patient tolerated the procedure well with improvement in symptoms  Patient given exercises, stretches and lifestyle modifications  See medications in patient instructions if given  Patient will follow up in 4-8 weeks     The above documentation has been reviewed and is accurate and complete Arryana Tolleson M Faelynn Wynder, DO         Note: This dictation was prepared with Dragon dictation along with smaller phrase technology. Any transcriptional errors that result from this process are unintentional.

## 2024-03-21 ENCOUNTER — Encounter: Payer: Self-pay | Admitting: Family Medicine

## 2024-03-21 ENCOUNTER — Ambulatory Visit: Admitting: Family Medicine

## 2024-03-21 VITALS — BP 106/70 | HR 64 | Ht 64.0 in | Wt 156.0 lb

## 2024-03-21 DIAGNOSIS — M9903 Segmental and somatic dysfunction of lumbar region: Secondary | ICD-10-CM | POA: Diagnosis not present

## 2024-03-21 DIAGNOSIS — M9908 Segmental and somatic dysfunction of rib cage: Secondary | ICD-10-CM

## 2024-03-21 DIAGNOSIS — M9904 Segmental and somatic dysfunction of sacral region: Secondary | ICD-10-CM

## 2024-03-21 DIAGNOSIS — M542 Cervicalgia: Secondary | ICD-10-CM | POA: Diagnosis not present

## 2024-03-21 DIAGNOSIS — M9901 Segmental and somatic dysfunction of cervical region: Secondary | ICD-10-CM | POA: Diagnosis not present

## 2024-03-21 DIAGNOSIS — M9902 Segmental and somatic dysfunction of thoracic region: Secondary | ICD-10-CM | POA: Diagnosis not present

## 2024-03-21 NOTE — Assessment & Plan Note (Signed)
 Discussed, the aggravation seems to be secondary to more of the patient's weight at this time and discussed icing regimen and home exercises otherwise.  Will continue to work on core stability as well as strengthening with patient having the hypermobility.  Patient will follow-up again 6 to 8 weeks.

## 2024-03-21 NOTE — Patient Instructions (Signed)
 Good to see you! Increasing weight, lower reps Use weight vest for leg day only See you again in 2-3 months

## 2024-05-01 ENCOUNTER — Other Ambulatory Visit: Payer: Self-pay

## 2024-05-02 DIAGNOSIS — R635 Abnormal weight gain: Secondary | ICD-10-CM | POA: Diagnosis not present

## 2024-05-02 DIAGNOSIS — J45990 Exercise induced bronchospasm: Secondary | ICD-10-CM | POA: Diagnosis not present

## 2024-05-02 DIAGNOSIS — M353 Polymyalgia rheumatica: Secondary | ICD-10-CM | POA: Diagnosis not present

## 2024-05-02 DIAGNOSIS — E039 Hypothyroidism, unspecified: Secondary | ICD-10-CM | POA: Diagnosis not present

## 2024-05-02 DIAGNOSIS — Z0000 Encounter for general adult medical examination without abnormal findings: Secondary | ICD-10-CM | POA: Diagnosis not present

## 2024-05-02 DIAGNOSIS — Z1331 Encounter for screening for depression: Secondary | ICD-10-CM | POA: Diagnosis not present

## 2024-05-02 DIAGNOSIS — M542 Cervicalgia: Secondary | ICD-10-CM | POA: Diagnosis not present

## 2024-05-02 DIAGNOSIS — E282 Polycystic ovarian syndrome: Secondary | ICD-10-CM | POA: Diagnosis not present

## 2024-05-14 NOTE — Progress Notes (Unsigned)
  [facility] [address] [place] [address] Phone: [phone] Subjective:   Stacey Parker, am serving as a scribe for Dr. Arthea Claudene.  I'm seeing this patient by the request  of:  Patient, No Pcp Per  CC: Low back pain  YEP:Dlagzrupcz  Stacey Parker is a 53 y.o. female coming in with complaint of back and neck pain. OMT on 03/21/2024. Patient states low back discomfort. R leg starting to go numb. Hasn't been this bad in years.  Medications patient has been prescribed: Gabapentin   Taking:         Reviewed prior external information including notes and imaging from previsou exam, outside providers and external EMR if available.   As well as notes that were available from care everywhere and other healthcare systems.  Past medical history, social, surgical and family history all reviewed in electronic medical record.  No pertanent information unless stated regarding to the chief complaint.   Past Medical History:  Diagnosis Date   Hashimoto's thyroiditis    Polycystic ovary     Allergies  Allergen Reactions   Sulfa Antibiotics Other (See Comments)     Review of Systems:  No headache, visual changes, nausea, vomiting, diarrhea, constipation, dizziness, abdominal pain, skin rash, fevers, chills, night sweats, weight loss, swollen lymph nodes, body aches, joint swelling, chest pain, shortness of breath, mood changes. POSITIVE muscle aches  Objective  Blood pressure 106/62, pulse 69, height 5' 4 (1.626 m), weight 161 lb (73 kg), SpO2 98%.   General: No apparent distress alert and oriented x3 mood and affect normal, dressed appropriately.  HEENT: Pupils equal, extraocular movements intact  Respiratory: Patient's speak in full sentences and does not appear short of breath  Cardiovascular: No lower extremity edema, non tender, no erythema  Gait MSK:  Back low back does have some loss lordosis noted.  Some tenderness to  palpation in the paraspinal musculature.  Some tightness noted FABER test on the right side as well as a positive straight leg test noted.  Severe tenderness noted in the paraspinal musculature in the paraspinal musculature on the left side of the lower back.  Osteopathic findings  C2 flexed rotated and side bent right C6 flexed rotated and side bent left T3 extended rotated and side bent right inhaled rib T9 extended rotated and side bent left L2 flexed rotated and side bent right L4 flexed rotated and side bent left Sacrum right on right       Assessment and Plan:  Low back pain Chronic problem with a significant exacerbation noted.  Discussed icing regimen and home exercises, discussed which activities to do and which ones to avoid.  Increase activity slowly.  Discussed icing regimen.    Nonallopathic problems  Decision today to treat with OMT was based on Physical Exam  After verbal consent patient was treated with HVLA, ME, FPR techniques in cervical, rib, thoracic, lumbar, and sacral  areas  Patient tolerated the procedure well with improvement in symptoms  Patient given exercises, stretches and lifestyle modifications  See medications in patient instructions if given  Patient will follow up in 4-8 weeks     The above documentation has been reviewed and is accurate and complete Stacey Luecke M [name], DO         Note: This dictation was prepared with Dragon dictation along with smaller phrase technology. Any transcriptional errors that result from this process are unintentional.

## 2024-05-16 ENCOUNTER — Ambulatory Visit: Admitting: Family Medicine

## 2024-05-16 ENCOUNTER — Encounter: Payer: Self-pay | Admitting: Family Medicine

## 2024-05-16 ENCOUNTER — Other Ambulatory Visit: Payer: Self-pay

## 2024-05-16 VITALS — BP 106/62 | HR 69 | Ht 64.0 in | Wt 161.0 lb

## 2024-05-16 DIAGNOSIS — M9903 Segmental and somatic dysfunction of lumbar region: Secondary | ICD-10-CM | POA: Diagnosis not present

## 2024-05-16 DIAGNOSIS — M9908 Segmental and somatic dysfunction of rib cage: Secondary | ICD-10-CM

## 2024-05-16 DIAGNOSIS — M9901 Segmental and somatic dysfunction of cervical region: Secondary | ICD-10-CM | POA: Diagnosis not present

## 2024-05-16 DIAGNOSIS — M9902 Segmental and somatic dysfunction of thoracic region: Secondary | ICD-10-CM | POA: Diagnosis not present

## 2024-05-16 DIAGNOSIS — M9904 Segmental and somatic dysfunction of sacral region: Secondary | ICD-10-CM

## 2024-05-16 DIAGNOSIS — M545 Low back pain, unspecified: Secondary | ICD-10-CM | POA: Diagnosis not present

## 2024-05-16 DIAGNOSIS — G8929 Other chronic pain: Secondary | ICD-10-CM | POA: Diagnosis not present

## 2024-05-16 MED ORDER — METHYLPREDNISOLONE ACETATE 80 MG/ML IJ SUSP
80.0000 mg | Freq: Once | INTRAMUSCULAR | Status: AC
  Administered 2024-05-16: 80 mg via INTRAMUSCULAR

## 2024-05-16 MED ORDER — KETOROLAC TROMETHAMINE 60 MG/2ML IM SOLN
60.0000 mg | Freq: Once | INTRAMUSCULAR | Status: AC
  Administered 2024-05-16: 60 mg via INTRAMUSCULAR

## 2024-05-16 MED ORDER — TIZANIDINE HCL 4 MG PO TABS
4.0000 mg | ORAL_TABLET | Freq: Every evening | ORAL | 2 refills | Status: AC
Start: 2024-05-16 — End: 2025-02-10
  Filled 2024-05-16: qty 90, 90d supply, fill #0

## 2024-05-16 NOTE — Assessment & Plan Note (Addendum)
 Chronic problem with a significant exacerbation noted.  Discussed icing regimen and home exercises, discussed which activities to do and which ones to avoid.  Increase activity slowly.  Discussed icing regimen.  Attempted osteopathic ambulation with some minimal benefit.  Toradol and Depo-Medrol injections given today as well.  Increase activity slowly.  Zanaflex  4 mg given for nighttime pain and help with sleep.  Follow-up again in 6 to 8 weeks otherwise.

## 2024-05-16 NOTE — Patient Instructions (Signed)
 Cocktail injection Wait until Monday to workout Look up primal movements Tennis in November See you again in 2 months

## 2024-05-27 ENCOUNTER — Other Ambulatory Visit: Payer: Self-pay | Admitting: Family Medicine

## 2024-05-27 ENCOUNTER — Other Ambulatory Visit: Payer: Self-pay

## 2024-05-28 ENCOUNTER — Other Ambulatory Visit: Payer: Self-pay

## 2024-05-28 DIAGNOSIS — M353 Polymyalgia rheumatica: Secondary | ICD-10-CM | POA: Diagnosis not present

## 2024-05-28 MED FILL — Gabapentin Cap 300 MG: ORAL | 90 days supply | Qty: 90 | Fill #0 | Status: AC

## 2024-05-29 ENCOUNTER — Other Ambulatory Visit: Payer: Self-pay

## 2024-05-30 ENCOUNTER — Other Ambulatory Visit: Payer: Self-pay

## 2024-07-15 NOTE — Progress Notes (Unsigned)
 [facility] [address] [place] [address] Phone: [phone] Subjective:   Stacey Parker Stacey Parker, am serving as a scribe for Dr. Arthea Parker.  I'm seeing this patient by the request  of:  Patient, No Pcp Per  CC: Back and neck pain follow-up  YEP:Dlagzrupcz  Stacey Parker is a 53 y.o. female coming in with complaint of back and neck pain. OMT on 05/16/2024. Patient states that her lower back on the L side is painful. Not able to work out due to pain. Able to walk for exercise. Pain seems to wax and wane. Last week her pain was more intense like when she suffered acute injury. Denies any radicular symptoms. November was a heavy travel month for her.   Medications patient has been prescribed: gabapentin  zanaflex   Taking: Yes         Reviewed prior external information including notes and imaging from previsou exam, outside providers and external EMR if available.   As well as notes that were available from care everywhere and other healthcare systems.  Past medical history, social, surgical and family history all reviewed in electronic medical record.  No pertanent information unless stated regarding to the chief complaint.   Past Medical History:  Diagnosis Date   Hashimoto's thyroiditis    Polycystic ovary     Allergies[1]   Review of Systems:  No headache, visual changes, nausea, vomiting, diarrhea, constipation, dizziness, abdominal pain, skin rash, fevers, chills, night sweats, weight loss, swollen lymph nodes, body aches, joint swelling, chest pain, shortness of breath, mood changes. POSITIVE muscle aches  Objective  Blood pressure 108/78, pulse (!) 56, height 5' 4 (1.626 m), weight 156 lb (70.8 kg), SpO2 97%.   General: No apparent distress alert and oriented x3 mood and affect normal, dressed appropriately.  HEENT: Pupils equal, extraocular movements intact  Respiratory: Patient's speak in full sentences and does not  appear short of breath  Cardiovascular: No lower extremity edema, non tender, no erythema  Gait MSK:  Back does have some mild loss of lordosis.  Some tenderness to palpation more actually in the parascapular area on the right than the left.  Some limited sidebending bilaterally.  Severely tender to palpation over the left sacroiliac joint.  Positive Faber on the left.  Osteopathic findings  C2 flexed rotated and side bent right C6 flexed rotated and side bent left T3 extended rotated and side bent right inhaled rib T9 extended rotated and side bent left L2 flexed rotated and side bent left Sacrum left on left   After verbal consent patient was prepped with alcohol swab and with a 21-gauge 2 inch needle injected into the left sacroiliac joint with a total of 2 cc 0.5% Marcaine and 1 cc of Kenalog 40 mg/mL.  No blood loss, Band-Aid placed.  Postinjection instruction given    Assessment and Plan:  Low back pain Multifactorial, seem to have more gluteal pain.  I tried a left sacroiliac joint injection today.  Hopeful that this will be beneficial for this individual.  We discussed otherwise we would need advanced imaging to help patient's lumbar and pelvis.  Patient feels like she would like to avoid that if possible.  Follow-up with me again in 6 to 8 weeks otherwise    Nonallopathic problems  Decision today to treat with OMT was based on Physical Exam  After verbal consent patient was treated with HVLA, ME, FPR techniques in cervical, rib, thoracic, lumbar, and sacral  areas  Patient tolerated the procedure well with improvement in symptoms  Patient given exercises, stretches and lifestyle modifications  See medications in patient instructions if given  Patient will follow up in 4-8 weeks    The above documentation has been reviewed and is accurate and complete Stacey Parker M Stacey Metzner, DO          Note: This dictation was prepared with Dragon dictation along with smaller phrase  technology. Any transcriptional errors that result from this process are unintentional.            [1]  Allergies Allergen Reactions   Sulfa Antibiotics Other (See Comments)

## 2024-07-16 ENCOUNTER — Encounter: Payer: Self-pay | Admitting: Family Medicine

## 2024-07-16 ENCOUNTER — Ambulatory Visit: Admitting: Family Medicine

## 2024-07-16 VITALS — BP 108/78 | HR 56 | Ht 64.0 in | Wt 156.0 lb

## 2024-07-16 DIAGNOSIS — M9901 Segmental and somatic dysfunction of cervical region: Secondary | ICD-10-CM

## 2024-07-16 DIAGNOSIS — M9902 Segmental and somatic dysfunction of thoracic region: Secondary | ICD-10-CM

## 2024-07-16 DIAGNOSIS — M9908 Segmental and somatic dysfunction of rib cage: Secondary | ICD-10-CM

## 2024-07-16 DIAGNOSIS — G8929 Other chronic pain: Secondary | ICD-10-CM

## 2024-07-16 DIAGNOSIS — M545 Low back pain, unspecified: Secondary | ICD-10-CM | POA: Diagnosis not present

## 2024-07-16 DIAGNOSIS — M9903 Segmental and somatic dysfunction of lumbar region: Secondary | ICD-10-CM | POA: Diagnosis not present

## 2024-07-16 DIAGNOSIS — M9904 Segmental and somatic dysfunction of sacral region: Secondary | ICD-10-CM

## 2024-07-16 NOTE — Assessment & Plan Note (Signed)
 Multifactorial, seem to have more gluteal pain.  I tried a left sacroiliac joint injection today.  Hopeful that this will be beneficial for this individual.  We discussed otherwise we would need advanced imaging to help patient's lumbar and pelvis.  Patient feels like she would like to avoid that if possible.  Follow-up with me again in 6 to 8 weeks otherwise

## 2024-07-16 NOTE — Patient Instructions (Signed)
 Injected L SI jt

## 2024-07-26 ENCOUNTER — Other Ambulatory Visit: Payer: Self-pay

## 2024-07-29 ENCOUNTER — Other Ambulatory Visit: Payer: Self-pay

## 2024-07-29 MED ORDER — LEVOTHYROXINE SODIUM 50 MCG PO TABS
50.0000 ug | ORAL_TABLET | Freq: Every day | ORAL | 3 refills | Status: AC
  Filled 2024-07-29: qty 90, 90d supply, fill #0

## 2024-08-29 ENCOUNTER — Other Ambulatory Visit: Payer: Self-pay

## 2024-08-29 ENCOUNTER — Other Ambulatory Visit: Payer: Self-pay | Admitting: Family Medicine

## 2024-08-29 MED ORDER — GABAPENTIN 300 MG PO CAPS
300.0000 mg | ORAL_CAPSULE | Freq: Every day | ORAL | 0 refills | Status: AC
  Filled 2024-08-29 – 2024-08-30 (×2): qty 90, 90d supply, fill #0

## 2024-08-30 ENCOUNTER — Other Ambulatory Visit: Payer: Self-pay

## 2024-09-03 ENCOUNTER — Other Ambulatory Visit: Payer: Self-pay

## 2024-09-04 ENCOUNTER — Encounter: Payer: Self-pay | Admitting: Family Medicine

## 2024-09-04 ENCOUNTER — Other Ambulatory Visit: Payer: Self-pay

## 2024-09-04 ENCOUNTER — Ambulatory Visit: Admitting: Family Medicine

## 2024-09-04 VITALS — BP 108/74 | HR 62 | Ht 64.0 in | Wt 156.0 lb

## 2024-09-04 DIAGNOSIS — M545 Low back pain, unspecified: Secondary | ICD-10-CM | POA: Diagnosis not present

## 2024-09-04 DIAGNOSIS — M9908 Segmental and somatic dysfunction of rib cage: Secondary | ICD-10-CM

## 2024-09-04 DIAGNOSIS — M9904 Segmental and somatic dysfunction of sacral region: Secondary | ICD-10-CM

## 2024-09-04 DIAGNOSIS — M9903 Segmental and somatic dysfunction of lumbar region: Secondary | ICD-10-CM | POA: Diagnosis not present

## 2024-09-04 DIAGNOSIS — M9901 Segmental and somatic dysfunction of cervical region: Secondary | ICD-10-CM | POA: Diagnosis not present

## 2024-09-04 DIAGNOSIS — G8929 Other chronic pain: Secondary | ICD-10-CM

## 2024-09-04 DIAGNOSIS — M9902 Segmental and somatic dysfunction of thoracic region: Secondary | ICD-10-CM

## 2024-09-04 MED ORDER — DULOXETINE HCL 20 MG PO CPEP
20.0000 mg | ORAL_CAPSULE | Freq: Every day | ORAL | 2 refills | Status: AC
  Filled 2024-09-04: qty 30, 30d supply, fill #0

## 2024-09-04 NOTE — Assessment & Plan Note (Signed)
 Tenderness noted in the lower back.  Continue to monitor closely.  Discussed the possibility of different medications and given the Cymbalta .  Warned of potential side effects but do think it will be helpful.  20 mg prescribed today.  Encourage patient to continue to try to increase activity once patient is not traveling as much.  Follow-up again in 6 to 8 weeks

## 2024-09-04 NOTE — Patient Instructions (Signed)
 Cymbalta  20mg   Stay active See me in 6 weeks Continue gabapentin 

## 2024-10-16 ENCOUNTER — Ambulatory Visit: Admitting: Family Medicine
